# Patient Record
Sex: Male | Born: 1980 | Race: White | Hispanic: No | Marital: Single | Smoking: Former smoker
Health system: Southern US, Community
[De-identification: ages and names within clinical notes are randomized; demographics above are authoritative.]

## PROBLEM LIST (undated history)

## (undated) DIAGNOSIS — C9200 Acute myeloblastic leukemia, not having achieved remission: Secondary | ICD-10-CM

---

## 2010-10-27 ENCOUNTER — Emergency Department (HOSPITAL_COMMUNITY): Payer: Commercial Indemnity

## 2010-10-27 ENCOUNTER — Emergency Department (HOSPITAL_COMMUNITY)
Admission: EM | Admit: 2010-10-27 | Discharge: 2010-10-27 | Disposition: A | Discharge status: Home / Self Care | Payer: Commercial Indemnity | Attending: Emergency Medicine | Admitting: Emergency Medicine

## 2010-10-27 DIAGNOSIS — R0789 Other chest pain: Secondary | ICD-10-CM | POA: Insufficient documentation

## 2010-10-27 DIAGNOSIS — R0602 Shortness of breath: Secondary | ICD-10-CM | POA: Insufficient documentation

## 2010-10-27 DIAGNOSIS — J40 Bronchitis, not specified as acute or chronic: Secondary | ICD-10-CM | POA: Insufficient documentation

## 2010-10-27 LAB — POCT I-STAT, CHEM 8
BUN: 13 mg/dL (ref 6–23)
Calcium, Ion: 1.22 mmol/L (ref 1.12–1.32)
Chloride: 103 mEq/L (ref 96–112)
Creatinine, Ser: 1 mg/dL (ref 0.50–1.35)
Glucose, Bld: 135 mg/dL — ABNORMAL HIGH (ref 70–99)
TCO2: 28 mmol/L (ref 0–100)

## 2010-10-27 LAB — TROPONIN I: Troponin I: <0.3 ng/mL (ref <0.30)

## 2010-10-27 LAB — CK TOTAL AND CKMB (NOT AT ARMC): Total CK: 53 U/L (ref 7–232)

## 2013-05-11 ENCOUNTER — Ambulatory Visit (INDEPENDENT_AMBULATORY_CARE_PROVIDER_SITE_OTHER): Payer: Managed Care, Other (non HMO) | Admitting: Physician Assistant

## 2013-05-11 VITALS — BP 122/92 | HR 80 | Temp 99.0°F | Resp 18 | Ht 68.0 in | Wt 128.0 lb

## 2013-05-11 DIAGNOSIS — R110 Nausea: Secondary | ICD-10-CM

## 2013-05-11 DIAGNOSIS — R109 Unspecified abdominal pain: Secondary | ICD-10-CM

## 2013-05-11 DIAGNOSIS — R197 Diarrhea, unspecified: Secondary | ICD-10-CM

## 2013-05-11 LAB — POCT CBC
Lymph, poc: 2.8 (ref 0.6–3.4)
MCH, POC: 33.2 pg — AB (ref 27–31.2)
MCHC: 32.7 g/dL (ref 31.8–35.4)
MCV: 101.4 fL — AB (ref 80–97)
MID (cbc): 1.7 — AB (ref 0–0.9)
POC LYMPH PERCENT: 40.8 %L (ref 10–50)
POC MID %: 25.3 %M — AB (ref 0–12)
Platelet Count, POC: 167 10*3/uL (ref 142–424)
RBC: 4.13 M/uL — AB (ref 4.69–6.13)
WBC: 6.8 10*3/uL (ref 4.6–10.2)

## 2013-05-11 MED ORDER — ONDANSETRON 4 MG PO TBDP
4.0000 mg | ORAL_TABLET | Freq: Once | ORAL | Status: AC
  Administered 2013-05-11: 4 mg via ORAL

## 2013-05-11 MED ORDER — ONDANSETRON 4 MG PO TBDP: 4.0000 mg | ORAL_TABLET | Freq: Three times a day (TID) | ORAL | Status: DC | PRN

## 2013-05-11 MED ORDER — DICYCLOMINE HCL 10 MG PO CAPS: 10.0000 mg | ORAL_CAPSULE | Freq: Three times a day (TID) | ORAL | Status: DC

## 2013-05-11 NOTE — Patient Instructions (Signed)
Start with ice chips and then sips of clear fluids.  Once you are able to tolerated drinking liquids you can start with bland foods such as rice, apple sauce and toast.  If you can tolerate this you can advance diet as tolerated.  Limit dairy for several days to reduce return of diarrhea if you have been experiencing diarrhea.  

## 2013-05-11 NOTE — Progress Notes (Signed)
   Subjective:    Patient ID: Alexander Levine, male    DOB: 22-Jan-1981, 32 y.o.   MRN: [id number]  HPI Pt presents to clinic with 3 day h/o stomach cramps and loose stool that is not more frequent.  He is nauseated but he is not had an episodes of vomiting.  He has no known sick contacts with similar symptoms.  He has been eating small amounts of food and drinking a small amount of fluids.  He is having some lightheadedness when he changes positions quickly.  Pt missed work today due to the abd cramping and feeling poorly when he stood up.  He works in a [employer] and was worried about standing all day.  Review of Systems  Constitutional: Negative for fever and chills.  Gastrointestinal: Positive for nausea, abdominal pain and diarrhea. Negative for vomiting.       Objective:   Physical Exam  Vitals reviewed. Constitutional: He is oriented to person, place, and time. He appears well-developed and well-nourished.  Pt appears like he does not feel well.    HENT:  Head: Normocephalic and atraumatic.  Right Ear: Hearing and external ear normal.  Left Ear: Hearing and external ear normal.  Eyes: Conjunctivae are normal.  Neck: Normal range of motion.  Cardiovascular: Normal rate, regular rhythm and normal heart sounds.   No murmur heard. Pulmonary/Chest: Effort normal and breath sounds normal.  Abdominal: Soft. There is tenderness (generalized TTP - no area of significant tenderness).  Neurological: He is alert and oriented to person, place, and time.  Skin: Skin is warm and dry. There is pallor.  Psychiatric: He has a normal mood and affect. His behavior is normal. Judgment and thought content normal.   Results for orders placed in visit on 05/11/13  POCT CBC      Result Value Range   WBC 6.8  4.6 - 10.2 K/uL   Lymph, poc 2.8  0.6 - 3.4   POC LYMPH PERCENT 40.8  10 - 50 %L   MID (cbc) 1.7 (*) 0 - 0.9   POC MID % 25.3 (*) 0 - 12 %M   POC Granulocyte 2.3  2 - 6.9   Granulocyte percent  33.9 (*) 37 - 80 %G   RBC 4.13 (*) 4.69 - 6.13 M/uL   Hemoglobin 13.7 (*) 14.1 - 18.1 g/dL   HCT, POC 40.9 (*) 81.1 - 53.7 %   MCV 101.4 (*) 80 - 97 fL   MCH, POC 33.2 (*) 27 - 31.2 pg   MCHC 32.7  31.8 - 35.4 g/dL   RDW, POC 91.4     Platelet Count, POC 167  142 - 424 K/uL   MPV 8.3  0 - 99.8 fL       Assessment & Plan:  Diarrhea - Plan: POCT CBC, ondansetron (ZOFRAN-ODT) disintegrating tablet 4 mg (pt feels better after he had the zofran)  Nausea alone - Plan: ondansetron (ZOFRAN ODT) 4 MG disintegrating tablet  Abdominal cramping - Plan: dicyclomine (BENTYL) 10 MG capsule  Anemia - Pt has no h/o anemia and labs from 2 years ago were normal - it is mild today so pt will f/u when he is better for repeat CBC - he will take a MVI with iron and eat healthly veggies.  Pt will go home and push fluids - he will f/u if he is unable to return to work in 48h due to lightheaded feeling.  Benny Lennert PA-C 05/11/2013 3:27 PM

## 2013-05-19 ENCOUNTER — Emergency Department (HOSPITAL_COMMUNITY)
Admission: EM | Admit: 2013-05-19 | Discharge: 2013-05-20 | Disposition: A | Discharge status: Home / Self Care | Payer: Commercial Indemnity | Attending: Emergency Medicine | Admitting: Emergency Medicine

## 2013-05-19 ENCOUNTER — Emergency Department (HOSPITAL_COMMUNITY): Payer: Commercial Indemnity

## 2013-05-19 ENCOUNTER — Encounter (HOSPITAL_COMMUNITY): Payer: Self-pay | Admitting: Emergency Medicine

## 2013-05-19 DIAGNOSIS — F17200 Nicotine dependence, unspecified, uncomplicated: Secondary | ICD-10-CM | POA: Insufficient documentation

## 2013-05-19 DIAGNOSIS — R109 Unspecified abdominal pain: Secondary | ICD-10-CM

## 2013-05-19 DIAGNOSIS — K529 Noninfective gastroenteritis and colitis, unspecified: Secondary | ICD-10-CM

## 2013-05-19 DIAGNOSIS — Z79899 Other long term (current) drug therapy: Secondary | ICD-10-CM | POA: Insufficient documentation

## 2013-05-19 DIAGNOSIS — K5289 Other specified noninfective gastroenteritis and colitis: Secondary | ICD-10-CM | POA: Insufficient documentation

## 2013-05-19 LAB — URINALYSIS, ROUTINE W REFLEX MICROSCOPIC
BILIRUBIN URINE: NEGATIVE
GLUCOSE, UA: NEGATIVE mg/dL
HGB URINE DIPSTICK: NEGATIVE
Ketones, ur: 15 mg/dL — AB
Leukocytes, UA: NEGATIVE
Nitrite: NEGATIVE
PH: 6 (ref 5.0–8.0)
Protein, ur: NEGATIVE mg/dL
SPECIFIC GRAVITY, URINE: 1.026 (ref 1.005–1.030)
UROBILINOGEN UA: 0.2 mg/dL (ref 0.0–1.0)

## 2013-05-19 LAB — CBC WITH DIFFERENTIAL/PLATELET
BASOS PCT: 0 % (ref 0–1)
Basophils Absolute: 0 10*3/uL (ref 0.0–0.1)
EOS PCT: 2 % (ref 0–5)
Eosinophils Absolute: 0.3 10*3/uL (ref 0.0–0.7)
HEMATOCRIT: 36.1 % — AB (ref 39.0–52.0)
HEMOGLOBIN: 13.2 g/dL (ref 13.0–17.0)
LYMPHS ABS: 11.2 10*3/uL — AB (ref 0.7–4.0)
Lymphocytes Relative: 68 % — ABNORMAL HIGH (ref 12–46)
MCH: 34.4 pg — AB (ref 26.0–34.0)
MCHC: 36.6 g/dL — ABNORMAL HIGH (ref 30.0–36.0)
MCV: 94 fL (ref 78.0–100.0)
MONOS PCT: 0 % — AB (ref 3–12)
Monocytes Absolute: 0 10*3/uL — ABNORMAL LOW (ref 0.1–1.0)
NEUTROS ABS: 4.9 10*3/uL (ref 1.7–7.7)
Neutrophils Relative %: 27 % — ABNORMAL LOW (ref 43–77)
PROMYELOCYTES ABS: 3 %
Platelets: 213 10*3/uL (ref 150–400)
RBC: 3.84 MIL/uL — AB (ref 4.22–5.81)
RDW: 13.5 % (ref 11.5–15.5)
WBC: 16.4 10*3/uL — AB (ref 4.0–10.5)

## 2013-05-19 LAB — COMPREHENSIVE METABOLIC PANEL
ALK PHOS: 40 U/L (ref 39–117)
ALT: 13 U/L (ref 0–53)
AST: 18 U/L (ref 0–37)
Albumin: 2.8 g/dL — ABNORMAL LOW (ref 3.5–5.2)
BUN: 9 mg/dL (ref 6–23)
CO2: 26 meq/L (ref 19–32)
Calcium: 8.8 mg/dL (ref 8.4–10.5)
Chloride: 100 mEq/L (ref 96–112)
Creatinine, Ser: 0.81 mg/dL (ref 0.50–1.35)
GFR calc non Af Amer: >90 mL/min (ref >90)
GLUCOSE: 116 mg/dL — AB (ref 70–99)
POTASSIUM: 4.2 meq/L (ref 3.7–5.3)
SODIUM: 139 meq/L (ref 137–147)
TOTAL PROTEIN: 6.3 g/dL (ref 6.0–8.3)
Total Bilirubin: 0.3 mg/dL (ref 0.3–1.2)

## 2013-05-19 LAB — LIPASE, BLOOD: Lipase: 17 U/L (ref 11–59)

## 2013-05-19 MED ORDER — HYDROCODONE-ACETAMINOPHEN 5-325 MG PO TABS: 1.0000 | ORAL_TABLET | Freq: Four times a day (QID) | ORAL | Status: DC | PRN

## 2013-05-19 MED ORDER — ONDANSETRON 4 MG PO TBDP: 4.0000 mg | ORAL_TABLET | Freq: Three times a day (TID) | ORAL | Status: DC | PRN

## 2013-05-19 MED ORDER — IOHEXOL 300 MG/ML  SOLN
25.0000 mL | Freq: Once | INTRAMUSCULAR | Status: AC | PRN
  Administered 2013-05-19: 25 mL via ORAL

## 2013-05-19 MED ORDER — IOHEXOL 300 MG/ML  SOLN
80.0000 mL | Freq: Once | INTRAMUSCULAR | Status: AC | PRN
  Administered 2013-05-19: 80 mL via INTRAVENOUS

## 2013-05-19 NOTE — ED Provider Notes (Signed)
CSN: [id number]     Arrival date & time 05/19/13  1634 History   First MD Initiated Contact with Patient 05/19/13 2200     Chief Complaint  Patient presents with  . Abdominal Pain   HPI  33 y/o male with no significant past medical history who presenst with cc of abdominal pain. The pain began approximately 1.5-2 weeks ago. Was seen at urgent care and had blood work obtained. No cause of pain indentified at that visit. He was discharged home with antiemetics and pain medication. He states his pain has continued. He denies any worsening. He states the pain is generalized. He describes it as sometimes sharp and sometimes achy. He denies any radiation. He denies fevers, chills, vomiting, or diarrhea. Last had a bowel movement this morning which was normal.    History reviewed. No pertinent past medical history. History reviewed. No pertinent past surgical history. History reviewed. No pertinent family history. History  Substance Use Topics  . Smoking status: Current Every Day Smoker    Types: Cigarettes  . Smokeless tobacco: Not on file  . Alcohol Use: No    Review of Systems  Constitutional: Negative for fever and chills.  Respiratory: Negative for cough.   Cardiovascular: Negative for chest pain.  Gastrointestinal: Positive for abdominal pain. Negative for nausea, vomiting, diarrhea, constipation and blood in stool.  Genitourinary: Negative for dysuria, frequency, hematuria and testicular pain.  All other systems reviewed and are negative.    Allergies  Review of patient's allergies indicates no known allergies.  Home Medications   Current Outpatient Rx  Name  Route  Sig  Dispense  Refill  . bisacodyl (BISACODYL) 5 MG EC tablet   Oral   Take 5 mg by mouth daily as needed for moderate constipation.         . dicyclomine (BENTYL) 10 MG capsule   Oral   Take 1 capsule (10 mg total) by mouth 4 (four) times daily -  before meals and at bedtime.   10 capsule   0   .  Multiple Vitamin (MULTIVITAMIN WITH MINERALS) TABS tablet   Oral   Take 1 tablet by mouth daily.         Marland Kitchen HYDROcodone-acetaminophen (NORCO) 5-325 MG per tablet   Oral   Take 1 tablet by mouth every 6 (six) hours as needed.   10 tablet   0   . ondansetron (ZOFRAN ODT) 4 MG disintegrating tablet   Oral   Take 1 tablet (4 mg total) by mouth every 8 (eight) hours as needed for nausea or vomiting. 4mg  ODT q4 hours prn nausea/vomit   10 tablet   0    BP 114/70  Pulse 84  Temp(Src) 99.6 F (37.6 C) (Oral)  Resp 18  Ht 5\' 8"  (1.727 m)  Wt 130 lb (58.968 kg)  BMI 19.77 kg/m2  SpO2 98% Physical Exam  Constitutional: He is oriented to person, place, and time. He appears well-developed and well-nourished. No distress.  HENT:  Head: Normocephalic and atraumatic.  Mouth/Throat: No oropharyngeal exudate.  Eyes: Conjunctivae are normal. Pupils are equal, round, and reactive to light.  Neck: Normal range of motion. Neck supple.  Cardiovascular: Normal rate and normal heart sounds.  Exam reveals no gallop and no friction rub.   No murmur heard. Pulmonary/Chest: Effort normal and breath sounds normal.  Abdominal: Soft. He exhibits no distension. There is generalized tenderness (mild).  Musculoskeletal: Normal range of motion. He exhibits no edema and no tenderness.  Neurological: He is alert and oriented to person, place, and time. He has normal strength and normal reflexes. No cranial nerve deficit or sensory deficit. Coordination normal. GCS eye subscore is 4. GCS verbal subscore is 5. GCS motor subscore is 6.  Skin: Skin is warm and dry.    ED Course  Procedures (including critical care time) Labs Review Labs Reviewed  COMPREHENSIVE METABOLIC PANEL - Abnormal; Notable for the following:    Glucose, Bld 116 (*)    Albumin 2.8 (*)    All other components within normal limits  URINALYSIS, ROUTINE W REFLEX MICROSCOPIC - Abnormal; Notable for the following:    Ketones, ur 15 (*)     All other components within normal limits  CBC WITH DIFFERENTIAL - Abnormal; Notable for the following:    WBC 16.4 (*)    RBC 3.84 (*)    HCT 36.1 (*)    MCH 34.4 (*)    MCHC 36.6 (*)    Neutrophils Relative % 27 (*)    Lymphocytes Relative 68 (*)    Monocytes Relative 0 (*)    Lymphs Abs 11.2 (*)    Monocytes Absolute 0.0 (*)    All other components within normal limits  LIPASE, BLOOD  PATHOLOGIST SMEAR REVIEW   Imaging Review Ct Abdomen Pelvis W Contrast  05/19/2013   CLINICAL DATA:  Abdominal pain  EXAM: CT ABDOMEN AND PELVIS WITH CONTRAST  TECHNIQUE: Multidetector CT imaging of the abdomen and pelvis was performed using the standard protocol following bolus administration of intravenous contrast.  CONTRAST:  69mL OMNIPAQUE IOHEXOL 300 MG/ML  SOLN  COMPARISON:  None.  FINDINGS: BODY WALL: Unremarkable.  LOWER CHEST: Unremarkable.  ABDOMEN/PELVIS:  Liver: No focal abnormality.  Biliary: No evidence of biliary obstruction or stone.  Pancreas: Unremarkable.  Spleen: Unremarkable.  Adrenals: Unremarkable.  Kidneys and ureters: No hydronephrosis. 12 mm and 9 mm presumed cysts in the right kidney. Contrast excretion limits sensitivity for detecting stones.  Bladder: Unremarkable.  Reproductive: Unremarkable.  Bowel: There is bowel wall thickening from submucosal edema affecting the distal duodenum and proximal jejunum. In between the inflamed segments, there is a nonthickened segment, suggesting skip lesion. Local lymphadenopathy and mesenteric fat infiltration. No evidence of abscess, obstruction, or perforation. Contrast in the the appendix, versus appendicolith. No acute appendicitis.  Retroperitoneum: No mass or adenopathy.  Peritoneum: Small volume free fluid in the pelvis, likely reactive to above.  OSSEOUS: No acute abnormalities. No sacroiliitis.  IMPRESSION: Proximal enteritis which could be infectious or inflammatory. There appears to be a skip segment, increasing the chances of inflammatory  bowel disease. No obstruction or perforation.   Electronically Signed   By: Jorje Guild M.D.   On: 05/19/2013 23:41    EKG Interpretation   None       MDM   Here with abdominal pain for last 1.5 weeks. Afebrile. VSS. Well appearing. Labs unremarkable. CT with evidence of enteritis with possible skip segment. Has never had history of weight loss, mucous diarrhea, bloody stools or fevers. Unlikely to be IBD but will refer to GI if he continues to have symptoms. Continue with antiemetics prn. Script for norco provided. Instructed to eat a bland diet. Follow up with a pcp. Return precautions given and discussed with the patient who was in agreement with the plan.    1. Abdominal pain   2. Enteritis       Donita Brooks, MD 05/20/13 [phone]

## 2013-05-19 NOTE — ED Notes (Signed)
Pt reports generalized abd pain x 2 week and having constipation, denies any n/v. No acute distress noted at triage.

## 2013-05-19 NOTE — ED Notes (Signed)
Patient transported to CT 

## 2013-05-19 NOTE — Discharge Instructions (Signed)

## 2013-05-20 LAB — PATHOLOGIST SMEAR REVIEW

## 2013-05-20 NOTE — ED Provider Notes (Signed)
I have personally seen and examined the patient.  I have discussed the plan of care with the resident.  I have reviewed the documentation on PMH/FH/Soc. History.  I have reviewed the documentation of the resident and agree.  Pt with continued abd pain and had diffuse tenderness on my evaluation, CT imaging advised.   Pt will need f/u with GI as outpatient   Sharyon Cable, MD 05/20/13 [phone]

## 2013-07-12 ENCOUNTER — Ambulatory Visit: Payer: Self-pay | Admitting: Hematology and Oncology

## 2013-08-01 ENCOUNTER — Inpatient Hospital Stay: Payer: Self-pay | Admitting: Internal Medicine

## 2013-08-01 LAB — URINALYSIS, COMPLETE
BACTERIA: NONE SEEN
Bilirubin,UR: NEGATIVE
Blood: NEGATIVE
Glucose,UR: NEGATIVE mg/dL (ref 0–75)
KETONE: NEGATIVE
Leukocyte Esterase: NEGATIVE
NITRITE: NEGATIVE
Ph: 6 (ref 4.5–8.0)
Protein: 30
RBC,UR: 1 /HPF (ref 0–5)
SQUAMOUS EPITHELIAL: NONE SEEN
Specific Gravity: 1.02 (ref 1.003–1.030)

## 2013-08-01 LAB — COMPREHENSIVE METABOLIC PANEL
ALBUMIN: 2.8 g/dL — AB (ref 3.4–5.0)
ALK PHOS: 68 U/L
AST: 51 U/L — AB (ref 15–37)
Anion Gap: 6 — ABNORMAL LOW (ref 7–16)
BILIRUBIN TOTAL: 0.4 mg/dL (ref 0.2–1.0)
BUN: 16 mg/dL (ref 7–18)
CALCIUM: 8.7 mg/dL (ref 8.5–10.1)
CHLORIDE: 101 mmol/L (ref 98–107)
Co2: 29 mmol/L (ref 21–32)
Creatinine: 1.19 mg/dL (ref 0.60–1.30)
EGFR (African American): >60
EGFR (Non-African Amer.): >60
GLUCOSE: 131 mg/dL — AB (ref 65–99)
Osmolality: 275 (ref 275–301)
POTASSIUM: 4.2 mmol/L (ref 3.5–5.1)
SGPT (ALT): 65 U/L (ref 12–78)
SODIUM: 136 mmol/L (ref 136–145)
Total Protein: 7.9 g/dL (ref 6.4–8.2)

## 2013-08-01 LAB — DIFFERENTIAL
Bands: 2 %
Lymphocytes: 21 %
MONOS PCT: 17 %
Metamyelocyte: 3 %
Myelocyte: 1 %
OTHER CELLS BLOOD: 30
SEGMENTED NEUTROPHILS: 26 %

## 2013-08-01 LAB — CBC
HCT: 13.6 % — CL (ref 40.0–52.0)
HGB: 4.8 g/dL — CL (ref 13.0–18.0)
MCH: 38.6 pg — AB (ref 26.0–34.0)
MCHC: 35.7 g/dL (ref 32.0–36.0)
MCV: 108 fL — AB (ref 80–100)
Platelet: 59 10*3/uL — ABNORMAL LOW (ref 150–440)
RBC: 1.25 10*6/uL — ABNORMAL LOW (ref 4.40–5.90)
RDW: 21.2 % — ABNORMAL HIGH (ref 11.5–14.5)
WBC: 13.8 10*3/uL — AB (ref 3.8–10.6)

## 2013-08-01 LAB — IRON AND TIBC
IRON BIND. CAP.(TOTAL): 230 ug/dL — AB (ref 250–450)
IRON SATURATION: 36 %
Iron: 82 ug/dL (ref 65–175)
Unbound Iron-Bind.Cap.: 148 ug/dL

## 2013-08-01 LAB — FOLATE: FOLIC ACID: 19.1 ng/mL (ref 3.1–100.0)

## 2013-08-01 LAB — LIPASE, BLOOD: Lipase: 55 U/L — ABNORMAL LOW (ref 73–393)

## 2013-08-01 LAB — FERRITIN: Ferritin (ARMC): 2568 ng/mL — ABNORMAL HIGH (ref 8–388)

## 2013-08-02 LAB — BASIC METABOLIC PANEL
ANION GAP: 4 — AB (ref 7–16)
BUN: 14 mg/dL (ref 7–18)
CALCIUM: 7.9 mg/dL — AB (ref 8.5–10.1)
CO2: 28 mmol/L (ref 21–32)
Chloride: 103 mmol/L (ref 98–107)
Creatinine: 1 mg/dL (ref 0.60–1.30)
EGFR (African American): >60
Glucose: 102 mg/dL — ABNORMAL HIGH (ref 65–99)
Osmolality: 271 (ref 275–301)
Potassium: 3.9 mmol/L (ref 3.5–5.1)
Sodium: 135 mmol/L — ABNORMAL LOW (ref 136–145)

## 2013-08-02 LAB — CBC WITH DIFFERENTIAL/PLATELET
BANDS NEUTROPHIL: 2 %
HCT: 17.2 % — ABNORMAL LOW (ref 40.0–52.0)
HGB: 6 g/dL — ABNORMAL LOW (ref 13.0–18.0)
LYMPHS PCT: 22 %
MCH: 33.9 pg (ref 26.0–34.0)
MCHC: 34.7 g/dL (ref 32.0–36.0)
MCV: 98 fL (ref 80–100)
MONOS PCT: 8 %
Myelocyte: 2 %
OTHER CELLS BLOOD: 40
Platelet: 49 10*3/uL — ABNORMAL LOW (ref 150–440)
RBC: 1.76 10*6/uL — AB (ref 4.40–5.90)
RDW: 25.3 % — AB (ref 11.5–14.5)
Segmented Neutrophils: 26 %
WBC: 13.6 10*3/uL — ABNORMAL HIGH (ref 3.8–10.6)

## 2013-08-02 LAB — LACTATE DEHYDROGENASE: LDH: 798 U/L — ABNORMAL HIGH (ref 85–241)

## 2013-08-02 LAB — HEMOGLOBIN: HGB: 6.6 g/dL — ABNORMAL LOW (ref 13.0–18.0)

## 2013-08-03 LAB — COMPREHENSIVE METABOLIC PANEL
ALT: 53 U/L (ref 12–78)
AST: 40 U/L — AB (ref 15–37)
Albumin: 2.3 g/dL — ABNORMAL LOW (ref 3.4–5.0)
Alkaline Phosphatase: 93 U/L
Anion Gap: 4 — ABNORMAL LOW (ref 7–16)
BUN: 13 mg/dL (ref 7–18)
Bilirubin,Total: 0.5 mg/dL (ref 0.2–1.0)
CHLORIDE: 106 mmol/L (ref 98–107)
CO2: 27 mmol/L (ref 21–32)
CREATININE: 0.83 mg/dL (ref 0.60–1.30)
Calcium, Total: 8.1 mg/dL — ABNORMAL LOW (ref 8.5–10.1)
EGFR (African American): >60
GLUCOSE: 89 mg/dL (ref 65–99)
OSMOLALITY: 273 (ref 275–301)
Potassium: 4.2 mmol/L (ref 3.5–5.1)
Sodium: 137 mmol/L (ref 136–145)
Total Protein: 6.6 g/dL (ref 6.4–8.2)

## 2013-08-03 LAB — CBC WITH DIFFERENTIAL/PLATELET
HCT: 22.8 % — ABNORMAL LOW (ref 40.0–52.0)
HGB: 7.7 g/dL — AB (ref 13.0–18.0)
LYMPHS PCT: 32 %
MCH: 32.2 pg (ref 26.0–34.0)
MCHC: 33.9 g/dL (ref 32.0–36.0)
MCV: 95 fL (ref 80–100)
Metamyelocyte: 1 %
Monocytes: 7 %
Myelocyte: 1 %
Other Cells Blood: 39
PLATELETS: 48 10*3/uL — AB (ref 150–440)
RBC: 2.4 10*6/uL — ABNORMAL LOW (ref 4.40–5.90)
RDW: 22.7 % — ABNORMAL HIGH (ref 11.5–14.5)
Segmented Neutrophils: 20 %
WBC: 14.9 10*3/uL — ABNORMAL HIGH (ref 3.8–10.6)

## 2013-08-06 LAB — CULTURE, BLOOD (SINGLE)

## 2013-12-12 ENCOUNTER — Ambulatory Visit: Payer: Self-pay | Admitting: Oncology

## 2013-12-13 LAB — COMPREHENSIVE METABOLIC PANEL
ALBUMIN: 3.1 g/dL — AB (ref 3.4–5.0)
ALK PHOS: 76 U/L
ANION GAP: 6 — AB (ref 7–16)
AST: 21 U/L (ref 15–37)
BUN: 15 mg/dL (ref 7–18)
Bilirubin,Total: 1.3 mg/dL — ABNORMAL HIGH (ref 0.2–1.0)
CALCIUM: 8.9 mg/dL (ref 8.5–10.1)
Chloride: 102 mmol/L (ref 98–107)
Co2: 26 mmol/L (ref 21–32)
Creatinine: 1.01 mg/dL (ref 0.60–1.30)
EGFR (Non-African Amer.): >60
GLUCOSE: 112 mg/dL — AB (ref 65–99)
OSMOLALITY: 270 (ref 275–301)
POTASSIUM: 3.5 mmol/L (ref 3.5–5.1)
SGPT (ALT): 30 U/L
Sodium: 134 mmol/L — ABNORMAL LOW (ref 136–145)
Total Protein: 7.2 g/dL (ref 6.4–8.2)

## 2013-12-13 LAB — PHOSPHORUS: PHOSPHORUS: 1.9 mg/dL — AB (ref 2.5–4.9)

## 2013-12-13 LAB — PROTIME-INR
INR: 1.2
Prothrombin Time: 14.9 secs — ABNORMAL HIGH (ref 11.5–14.7)

## 2013-12-13 LAB — MAGNESIUM: Magnesium: 1.6 mg/dL — ABNORMAL LOW

## 2013-12-13 LAB — TROPONIN I

## 2013-12-14 ENCOUNTER — Inpatient Hospital Stay: Payer: Self-pay | Admitting: Internal Medicine

## 2013-12-14 LAB — URINALYSIS, COMPLETE
BILIRUBIN, UR: NEGATIVE
GLUCOSE, UR: NEGATIVE mg/dL (ref 0–75)
KETONE: NEGATIVE
Leukocyte Esterase: NEGATIVE
Nitrite: NEGATIVE
Ph: 5 (ref 4.5–8.0)
SPECIFIC GRAVITY: 1.015 (ref 1.003–1.030)
SQUAMOUS EPITHELIAL: NONE SEEN

## 2013-12-14 LAB — BASIC METABOLIC PANEL
Anion Gap: 6 — ABNORMAL LOW (ref 7–16)
BUN: 8 mg/dL (ref 7–18)
Calcium, Total: 8.2 mg/dL — ABNORMAL LOW (ref 8.5–10.1)
Chloride: 105 mmol/L (ref 98–107)
Co2: 26 mmol/L (ref 21–32)
Creatinine: 0.98 mg/dL (ref 0.60–1.30)
EGFR (African American): >60
EGFR (Non-African Amer.): >60
Glucose: 101 mg/dL — ABNORMAL HIGH (ref 65–99)
Osmolality: 272 (ref 275–301)
Potassium: 3.3 mmol/L — ABNORMAL LOW (ref 3.5–5.1)
Sodium: 137 mmol/L (ref 136–145)

## 2013-12-14 LAB — CBC WITH DIFFERENTIAL/PLATELET
BASOS ABS: 0 10*3/uL (ref 0.0–0.1)
BASOS PCT: 0 %
Basophil #: 0 10*3/uL (ref 0.0–0.1)
Basophil %: 0 %
EOS ABS: 0 10*3/uL (ref 0.0–0.7)
EOS PCT: 4.2 %
Eosinophil #: 0 10*3/uL (ref 0.0–0.7)
Eosinophil %: 7 %
HCT: 21.9 % — ABNORMAL LOW (ref 40.0–52.0)
HCT: 18.2 % — ABNORMAL LOW (ref 40.0–52.0)
HGB: 8 g/dL — AB (ref 13.0–18.0)
HGB: 6.6 g/dL — ABNORMAL LOW (ref 13.0–18.0)
LYMPHS ABS: 0.3 10*3/uL — AB (ref 1.0–3.6)
Lymphocyte #: 0.3 10*3/uL — ABNORMAL LOW (ref 1.0–3.6)
Lymphocyte %: 90.8 %
Lymphocyte %: 80.7 %
MCH: 32.1 pg (ref 26.0–34.0)
MCH: 32.2 pg (ref 26.0–34.0)
MCHC: 36.5 g/dL — ABNORMAL HIGH (ref 32.0–36.0)
MCHC: 36.3 g/dL — ABNORMAL HIGH (ref 32.0–36.0)
MCV: 88 fL (ref 80–100)
MCV: 89 fL (ref 80–100)
MONOS PCT: 3.6 %
Monocyte #: 0 x10 3/mm — ABNORMAL LOW (ref 0.2–1.0)
Monocyte #: 0 x10 3/mm — ABNORMAL LOW (ref 0.2–1.0)
Monocyte %: 10.2 %
NEUTROS PCT: 1.4 %
Neutrophil #: 0 10*3/uL — ABNORMAL LOW (ref 1.4–6.5)
Neutrophil #: 0 10*3/uL — ABNORMAL LOW (ref 1.4–6.5)
Neutrophil %: 2.1 %
Platelet: 3 10*3/uL — CL (ref 150–440)
Platelet: 16 10*3/uL — CL (ref 150–440)
RBC: 2.49 10*6/uL — ABNORMAL LOW (ref 4.40–5.90)
RBC: 2.06 10*6/uL — ABNORMAL LOW (ref 4.40–5.90)
RDW: 16.1 % — AB (ref 11.5–14.5)
RDW: 16 % — ABNORMAL HIGH (ref 11.5–14.5)
WBC: 0.4 10*3/uL — AB (ref 3.8–10.6)
WBC: 0.3 10*3/uL — CL (ref 3.8–10.6)

## 2013-12-14 LAB — PLATELET COUNT: Platelet: 15 10*3/uL — CL (ref 150–440)

## 2013-12-14 LAB — MAGNESIUM: Magnesium: 2.2 mg/dL

## 2013-12-14 LAB — PHOSPHORUS: Phosphorus: 1.9 mg/dL — ABNORMAL LOW (ref 2.5–4.9)

## 2013-12-15 LAB — PHOSPHORUS
Phosphorus: 2.3 mg/dL — ABNORMAL LOW (ref 2.5–4.9)
Phosphorus: 2.8 mg/dL (ref 2.5–4.9)

## 2013-12-15 LAB — CBC WITH DIFFERENTIAL/PLATELET
Basophil #: 0 10*3/uL (ref 0.0–0.1)
Basophil #: 0 10*3/uL (ref 0.0–0.1)
Basophil %: 0.3 %
Basophil %: 0.3 %
Eosinophil #: 0 10*3/uL (ref 0.0–0.7)
Eosinophil #: 0 10*3/uL (ref 0.0–0.7)
Eosinophil %: 8.1 %
Eosinophil %: 5.5 %
HCT: 23.3 % — ABNORMAL LOW (ref 40.0–52.0)
HCT: 24.7 % — ABNORMAL LOW (ref 40.0–52.0)
HGB: 8.2 g/dL — ABNORMAL LOW (ref 13.0–18.0)
HGB: 9.2 g/dL — ABNORMAL LOW (ref 13.0–18.0)
Lymphocyte #: 0.3 10*3/uL — ABNORMAL LOW (ref 1.0–3.6)
Lymphocyte #: 0.4 10*3/uL — ABNORMAL LOW (ref 1.0–3.6)
Lymphocyte %: 66.6 %
Lymphocyte %: 54.5 %
MCH: 31.5 pg (ref 26.0–34.0)
MCH: 32.1 pg (ref 26.0–34.0)
MCHC: 35.4 g/dL (ref 32.0–36.0)
MCHC: 35.6 g/dL (ref 32.0–36.0)
MCV: 89 fL (ref 80–100)
MCV: 86 fL (ref 80–100)
Monocyte #: 0.1 x10 3/mm — ABNORMAL LOW (ref 0.2–1.0)
Monocyte #: 0.1 x10 3/mm — ABNORMAL LOW (ref 0.2–1.0)
Monocyte %: 15.9 %
Monocyte %: 18.2 %
Neutrophil #: 0 10*3/uL — ABNORMAL LOW (ref 1.4–6.5)
Neutrophil #: 0.1 10*3/uL — ABNORMAL LOW (ref 1.4–6.5)
Neutrophil %: 9.1 %
Neutrophil %: 21.5 %
Platelet: 11 10*3/uL — CL (ref 150–440)
Platelet: 10 10*3/uL — CL (ref 150–440)
RBC: 2.62 10*6/uL — ABNORMAL LOW (ref 4.40–5.90)
RBC: 2.87 10*6/uL — ABNORMAL LOW (ref 4.40–5.90)
RDW: 16.3 % — ABNORMAL HIGH (ref 11.5–14.5)
RDW: 15.9 % — ABNORMAL HIGH (ref 11.5–14.5)
WBC: 0.4 10*3/uL — CL (ref 3.8–10.6)
WBC: 0.7 10*3/uL — CL (ref 3.8–10.6)

## 2013-12-15 LAB — VANCOMYCIN, TROUGH: Vancomycin, Trough: 5 ug/mL — ABNORMAL LOW (ref 10–20)

## 2013-12-15 LAB — URINE CULTURE

## 2013-12-15 LAB — POTASSIUM: Potassium: 3.9 mmol/L (ref 3.5–5.1)

## 2013-12-16 LAB — CBC WITH DIFFERENTIAL/PLATELET
Basophil #: 0 10*3/uL (ref 0.0–0.1)
Basophil %: 0.2 %
Eosinophil #: 0.1 10*3/uL (ref 0.0–0.7)
Eosinophil %: 6.6 %
HCT: 23.4 % — ABNORMAL LOW (ref 40.0–52.0)
HGB: 8.3 g/dL — ABNORMAL LOW (ref 13.0–18.0)
Lymphocyte #: 0.5 10*3/uL — ABNORMAL LOW (ref 1.0–3.6)
Lymphocyte %: 45.1 %
MCH: 31.5 pg (ref 26.0–34.0)
MCHC: 35.6 g/dL (ref 32.0–36.0)
MCV: 89 fL (ref 80–100)
Monocyte #: 0.2 x10 3/mm (ref 0.2–1.0)
Monocyte %: 16.1 %
Neutrophil #: 0.3 10*3/uL — ABNORMAL LOW (ref 1.4–6.5)
Neutrophil %: 32 %
Platelet: 20 10*3/uL — CL (ref 150–440)
RBC: 2.64 10*6/uL — ABNORMAL LOW (ref 4.40–5.90)
RDW: 16 % — ABNORMAL HIGH (ref 11.5–14.5)
WBC: 1 10*3/uL — CL (ref 3.8–10.6)

## 2013-12-18 LAB — CULTURE, BLOOD (SINGLE)

## 2014-01-12 ENCOUNTER — Ambulatory Visit: Payer: Self-pay | Admitting: Oncology

## 2014-09-04 NOTE — Consult Note (Signed)
PATIENT NAME:  Alexander Levine, Alexander Levine MR#:  [id number] DATE OF BIRTH:  [birth year]  DATE OF CONSULTATION:  08/02/2013  REFERRING PHYSICIAN:   CONSULTING PHYSICIAN:  Lucilla Lame, MD  REASON FOR CONSULTATION:  Anemia with abdominal pain.   HISTORY OF PRESENT ILLNESS: This patient is a 34 year old gentleman who reports that he had ulcers 2 years ago and had an episode of abdominal pain that started in January. He had been seen at [facility] and was to follow up with GI. The patient at that time said that there was a finding on a CT scan showing possible inflammatory bowel disease versus infection. The patient says that he had felt better and did not follow up with GI until he started having symptoms again recently and was set up to see a gastroenterologist in April. He now comes with abdominal pain and fevers and had a temperature of 101 in the Emergency Department. The patient was found at that time to also have a hemoglobin of 4.8 with hematocrit of 13.6 and platelet count of 59,000. He had been taking anti-inflammatory medications for his pain but denies seeing any bright red blood or black stools. He has been found to be heme-positive. He reports that his abdominal pain is usually after he eats but can be at any time. He also denies any unexplained weight loss and states that he was approximately 10 pounds heavier a year ago but that this is a stable weight that he is right now. There is no report of any nausea or vomiting. The patient did have a CT scan that showed possible gastritis versus inflammatory bowel disease with free fluid in the abdomen.   PAST MEDICAL HISTORY: None.   MEDICATION AT HOME: NSAIDs.   ALLERGIES: No known drug allergies.   PAST SURGICAL HISTORY: None.   SOCIAL HISTORY: The patient quit smoking several months ago. No alcohol or IV drug use.   FAMILY HISTORY: Noncontributory.   REVIEW OF SYSTEMS: A10-point review of systems is negative except what was stated above.    PHYSICAL EXAMINATION: GENERAL: The patient is sitting in bed, no apparent distress, answering questions appropriately.  VITAL SIGNS: Temperature 98.6, pulse 76, respirations 17, blood pressure 108/66, pulse oximetry 98%.  GENERAL: Patient is alert and oriented x 3, in no apparent distress.   HEENT: Normocephalic, atraumatic. Extraocular motor intact. Pupils equally round and reactive to light and accommodation.  NECK:  Without JVD, without lymphadenopathy.  LUNGS: Clear to auscultation bilaterally.  HEART: Regular rate and rhythm without murmurs, rubs or gallops.  ABDOMEN: Soft with diffuse abdominal tenderness which is worse in the left upper quadrant without rebound but mild guarding.  EXTREMITIES: Without cyanosis, clubbing or edema.  NEUROLOGICAL: Grossly intact.  SKIN: Without any rashes or lesions.   LABORATORY, DIAGNOSTIC AND RADIOLOGICAL DATA:  Labs: Ferritin 2568, lipase 55, iron 36, total iron-binding capacity low at 230. Hemoglobin this morning 6.6 after transfusion, MCV 108 on admission, down to 98 this morning with a high RDW of 21.2 on admission. CT scan shows interval improvement in the enteritis seen on the comparison exam, increase in the intraperitoneal free fluid within the pelvis, mild thickening of the distal stomach suggestive of gastritis can be associated with inflammatory bowel disease, new bandlike atelectasis on the right middle lobe.   ASSESSMENT AND PLAN: This patient is a 34 year old gentleman with labs showing anemia with a high MCV and normal iron stores. The patient has been seen by oncology, who suggested that this may be a  chronic leukemia versus lymphoma. The patient will likely need a gastroenterology workup after the hematological workup is complete. The patient's hemoglobin is stable at this time. We will follow along with you until the patient is deemed ready for his esophagogastroduodenoscopy and colonoscopy.   Thank you very much for involving me in the  care of this patient. If you have any questions, please do not hesitate to call.   ____________________________ Lucilla Lame, MD dw:cs D: 08/02/2013 14:35:40 ET T: 08/02/2013 18:54:47 ET JOB#: [id number]  cc: Lucilla Lame, MD, <Dictator> Lucilla Lame MD ELECTRONICALLY SIGNED 08/03/2013 13:36

## 2014-09-04 NOTE — Consult Note (Signed)
Chief Complaint:  Subjective/Chief Complaint Pt has mild 3/10 upper abdominal discomfort.  Denies rectal bleeding or melena.  He is anxious & concerned about new cancer diagnosis & transfer to [facility].   VITAL SIGNS/ANCILLARY NOTES: **Vital Signs.:   23-Mar-15 11:38  Vital Signs Type Routine  Temperature Temperature (F) 99.2  Celsius 37.3  Temperature Source oral  Pulse Pulse 67  Systolic BP Systolic BP 202  Diastolic BP (mmHg) Diastolic BP (mmHg) 65  Mean BP 79  Pulse Ox % Pulse Ox % 97  Pulse Ox Activity Level  At rest  Oxygen Delivery Room Air/ 21 %   Brief Assessment:  GEN no acute distress, thin, A/Ox3, multiple family members at bedside   Cardiac Regular   Respiratory normal resp effort   Gastrointestinal details normal Soft  Nondistended  Bowel sounds normal   EXTR negative cyanosis/clubbing, negative edema   Additional Physical Exam Skin: pale, warm, dry   Assessment/Plan:  Assessment/Plan:  Assessment Abdominal pain & rectal bleeding in setting of pancytopenia with Acute leukemia/anemia: Pt being transferred to [facility] today & should continue GI work-up there Anemia: s/p 3 units of packed red blood cell transfusion with hemoglobin level of 7.7 today in with questionable diagnosis of bowel infection in January 2015.   Plan We will sign off on patient.  Advised to follow up as needed.  Please call if there are any questions or concerns.  Thank you for allowing [place] to participate in the care of this patient.   Electronic Signatures: Andria Meuse (NP)  (Signed 23-Mar-15 11:43)  Authored: Chief Complaint, VITAL SIGNS/ANCILLARY NOTES, Brief Assessment, Assessment/Plan   Last Updated: 23-Mar-15 11:43 by Andria Meuse (NP)

## 2014-09-04 NOTE — Consult Note (Signed)
Brief Consult Note: Diagnosis: patient admitted with abd pain and anemia. The patient has had the pain since Jan and had ulcers 2 years ago. He is not on any medication for this. His iron was found to be normal and he had some free fluid in his abd and a thickened stomach.   Patient was seen by consultant.   Comments: The patient presents with anemia of chronic disease and heme positive stools with normal iron. The patients past imaging has shown findings consistant with possible IBD. The patient is to be seen by hematology and may need an EGD and colonoscopy for the heme positive stools and abd pain. There is no reports of any diarrhea or gross blood loss.  Electronic Signatures: Lucilla Lame (MD)  (Signed 22-Mar-15 11:06)  Authored: Brief Consult Note   Last Updated: 22-Mar-15 11:06 by Lucilla Lame (MD)

## 2014-09-04 NOTE — Consult Note (Signed)
Patient's last fever was ~7pm last night. (12/14/13). Pancytopenia is essentially unchanged.  If he remains afebrile overnight and all cutures continue to be negative, OK to d/c from an oncology standpoint in the AM with close f/u at [facility] within 24-48 hrs after discharge. follow if patient remains in hospital.  Electronic Signatures: Delight Hoh (MD)  (Signed on 04-Aug-15 19:58)  Authored  Last Updated: 04-Aug-15 19:58 by Delight Hoh (MD)

## 2014-09-04 NOTE — H&P (Signed)
PATIENT NAME:  Alexander Levine, Alexander Levine MR#:  [id number] DATE OF BIRTH:  09/05/1980  DATE OF ADMISSION:  12/14/2013  REFERRING PHYSICIAN: Dr. Lisa Roca  PRIMARY CARE PHYSICIAN: [facility]  ADMITTING DIAGNOSES: Neutropenic fever, acute myelocytic leukemia, anemia, and thrombocytopenia.   HISTORY OF PRESENT ILLNESS: This is a 34 year old Caucasian male who presents to the Emergency Department with fever of 100.7 degrees Fahrenheit. The patient completed his last maintenance dose of chemotherapy for acute myelocytic leukemia one week ago. He felt nauseated, and had one episode of nonbloody, nonbilious vomiting earlier today, which prompted him to check his temperature, per routine. He came to the hospital as instructed. He  denies acute pain or chills at this time.   REVIEW OF SYSTEMS:  CONSTITUTIONAL: The patient admits to fever and weakness.  EYES: The patient denies inflammation or decreased visual acuity.  ENT: The patient denies stomatitis or epistaxis.  RESPIRATORY: The patient denies cough or shortness of breath.  CONSTITUTIONAL: The patient denies chest pain, orthopnea or palpitations.  GASTROINTESTINAL: The patient denies abdominal pain. He admits to nausea and vomiting. GENITOURINARY: The patient denies dysuria or increased frequency or hesitancy. ENDOCRINE: The patient denies nocturia, polyuria or intolerance to heat or cold.  HEMATOLOGIC AND LYMPHATIC: The patient admits to some easy bruising, but he denies active bleeding at this time.  INTEGUMENT: The patient denies rash or lesions.  MUSCULOSKELETAL: The patient admits to some myalgias but denies arthralgias.  NEUROLOGIC: The patient denies numbness or weakness or headache.  PSYCHIATRIC: The patient denies suicidal ideation or homicidal ideation.   PAST MEDICAL HISTORY: Significant for acute myelocytic leukemia.   PAST SURGICAL HISTORY: None.   FAMILY HISTORY: Negative for cancers. He denies hypertension,  diabetes or heart disease in the family history.   SOCIAL HISTORY: The patient does not smoke, drink or do any drugs. He has a fiancee.  MEDICATIONS: Medications Ambien 5 mg 1 tab p.o. at bedtime as needed, pantoprazole 40 mg every 12 hours, ceftazidime 2 grams every 8 hours and alprazolam 0.25 mg 1 tab p.o. every 8 hours.   ALLERGIES: COMPAZINE.   PHYSICAL EXAM:  VITAL SIGNS: Currently, heart rate is 97 temperature 99.2, respiratory rate 16, blood pressure 104/58, sats 100% on room air.  GENERAL: The patient is oriented x3, in no apparent distress.  HEENT: Normocephalic, atraumatic. Pupils are equal, round and reactive to light and accommodation. Extraocular movements are intact. Moist mucous membranes. No oral lesions. NECK: Trachea is midline. No adenopathy.  CHEST: Symmetric and atraumatic.  CARDIOVASCULAR: Regular rate and rhythm. Normal S1, S2. No rubs, clicks or murmurs. LUNGS: Clear to auscultation bilaterally, normal effort.  ABDOMEN: Positive bowel sounds. Soft, nontender, nontender, nondistended. No hepatosplenomegaly.  GENITOURINARY: Deferred.  MUSCULOSKELETAL: The patient moves all four extremities equally. Has 5/5 strength in upper and lower extremities bilaterally.  SKIN: No rashes or lesions.  EXTREMITIES: No clubbing, cyanosis, or edema.  NEUROLOGIC: Cranial nerves II through XII grossly intact.  PSYCHIATRIC: Mood is normal. Affect is congruent.   PERTINENT LABORATORY RESULTS AND RADIOLOGIC FINDINGS: Sodium 134. INR 1.2. Magnesium 1.6. Total bilirubin 1.3, phosphorus 1.9.   ASSESSMENT AND PLAN: This is a 34 year old male with acute myelocytic leukemia and neutropenic fever.  1.  Neutropenic fever. We will cover the blood cultures obtained as well as urine culture. We will cover the patient with vancomycin and Zosyn. It appears he has already received one dose of cefepime in the Emergency Department. Will monitor his neutrophil count.  2.  Acute  myelocytic leukemia. The  patient's white blood cell count is currently 400. His platelets are also low, as expected following chemotherapy. [facility] has been contacted, and transfer is pending when a bed opens.  3.  Anemia secondary to the above. The patient does not require a blood transfusion at this time. 4.  Thrombocytopenia, also secondary to acute myelocytic leukemia. Oncologist at [facility] recommended transfusing. Noted, the patient does not currently have any bleeding diatheses. We will premedicate per oncology instructions.  5.  Electrolyte abnormalities. Replete magnesium and phosphorous. I will place the patient on normal saline after his platelet transfusion, which should correct his sodium.  6.  Gastrointestinal prophylaxis. Pantoprazole. 7.  Deep vein thrombosis prophylaxis with SCDs.  8.  Neutropenic precautions.   CODE STATUS: The patient is a full code.   TIME SPENT ON ADMISSION ORDERS AND PATIENT CARE: Approximately 35 minutes.    ____________________________ Norva Riffle. Marcille Blanco, MD msd:cg D: 12/14/2013 00:52:25 ET T: 12/14/2013 02:40:48 ET JOB#: [id number]  cc: Norva Riffle. Marcille Blanco, MD, <Dictator> Norva Riffle Angelli Baruch MD ELECTRONICALLY SIGNED 12/14/2013 23:48

## 2014-09-04 NOTE — Consult Note (Signed)
History of Present Illness:  Reason for Consult Acute leukemia status post chemotherapy, now with neutropenic fever.   HPI   Patient is a 34 year old male with no significant past medical history who was recently diagnosed with acute leukemia. He received his induction chemotherapy at [facility] completing his treatment on Saturday, December 05, 2013. He presented to the emergency room with increasing fevers, weakness and fatigue, and found to be pancytopenic. Currently he feels improved from admission, but not back to baseline.   He has no neurologic complaints. He has a fair appetite. He has no chest pain or shortness of breath. He denies any cough or hemoptysis. He denies any nausea, vomiting, constipation, or diarrhea. He has no urinary complaints. Patient otherwise feels well and offers no further specific complaints.  PFSH:  Additional Past Medical and Surgical History negative.  Family history: Negative and noncontributory.  Social history: Patient denies tobacco or alcohol.   Review of Systems:  Performance Status (ECOG) 2   Review of Systems   As per HPI. Otherwise, 10 point system review was negative.   NURSING NOTES: **Vital Signs.:   03-Aug-15 16:00   Vital Signs Type: Blood Transfusion   Temperature Temperature (F): 102.5   Celsius: 39.1   Temperature Source: oral   Pulse source if not from Vital Sign Device: per cardiac monitor   Oxygen Delivery: Room Air/ 21 %   Physical Exam:  Physical Exam General: ill-appearing, no acute distress. Eyes: Pink conjunctiva, anicteric sclera. HEENT: Normocephalic, moist mucous membranes, clear oropharnyx. Lungs: Clear to auscultation bilaterally. Heart: Regular rate and rhythm. No rubs, murmurs, or gallops. Abdomen: Soft, nontender, nondistended. No organomegaly noted, normoactive bowel sounds. Musculoskeletal: No edema, cyanosis, or clubbing. Neuro: Alert, answering all questions appropriately. Cranial nerves grossly  intact. Skin: No rashes or petechiae noted. Psych: Normal affect.    Prochlorperazine: Anxiety, Agitation    pantoprazole 40 mg oral delayed release tablet: 1 tab(s) orally 2 times a day, Status: Active, Quantity: 0, Refills: None   Zofran 8 mg oral tablet:  orally every 8 hours, As Needed - for Nausea, Vomiting, Status: Active, Quantity: 0, Refills: None  Laboratory Results: Routine Chem:  03-Aug-15 09:26   Result Comment LABS - This specimen was collected through an   - indwelling catheter or arterial line.  - A minimum of 54ms of blood was wasted prior    - to collecting the sample.  Interpret  - results with caution. WBC/PLATELETS - RESULTS VERIFIED BY REPEAT TESTING.  - CRITICAL VALUE PREVIOUSLY NOTIFIED. DIFFERENTIAL - DUE TO THE LOW WBC, THE INSTRUMENT DIFF  - CANNOT BE CONFIRMED BY MANUAL DIFF AND  - IS REPORTED PRIMARILY TO IDENTIFY CELL  - TYPES PRESENT.  Result(s) reported on 14 Dec 2013 at 11:24AM.  Glucose, Serum  101  BUN 8  Creatinine (comp) 0.98  Sodium, Serum 137  Potassium, Serum  3.3  Chloride, Serum 105  CO2, Serum 26  Calcium (Total), Serum  8.2  Anion Gap  6  Osmolality (calc) 272  eGFR (African American) >60  eGFR (Non-African American) >60 (eGFR values <69mmin/1.73 m2 may be an indication of chronic kidney disease (CKD). Calculated eGFR is useful in patients with stable renal function. The eGFR calculation will not be reliable in acutely ill patients when serum creatinine is changing rapidly. It is not useful in  patients on dialysis. The eGFR calculation may not be applicable to patients at the low and high extremes of body sizes, pregnant women, and vegetarians.)  Routine  Hem:  03-Aug-15 09:26   WBC (CBC)  0.3  RBC (CBC)  2.06  Hemoglobin (CBC)  6.6  Hematocrit (CBC)  18.2  Platelet Count (CBC)  16  MCV 89  MCH 32.2  MCHC  36.3  RDW  16.0  Neutrophil % 2.1  Lymphocyte % 80.7  Monocyte % 10.2  Eosinophil % 7.0  Basophil % 0.0   Neutrophil #  0.0  Lymphocyte #  0.3  Monocyte #  0.0  Eosinophil # 0.0  Basophil # 0.0   Assessment and Plan: Impression:   acute leukemia, neutropenic fever, pancytopenia. Plan:   1. Acute leukemia: Patient completed his chemotherapy on December 05, 2013 at [facility]. His primary oncologist at [facility] has been consulted and is aware of his current admission.Anemia: Secondary to chemotherapy and leukemia. Patient's hemoglobin has dropped below 7.0 and will require one unit of packed red blood cells. Continue to monitor daily CBC and transfuse if you have and remains below 7.0.Thrombocytopenia: Secondary chemotherapy. Patient received platelets yesterday with improvement of his platelet count is greater than 10. Continue to monitor daily and transfuse again if platelets drop below 10.Neutropenia: Secondary chemotherapy, monitor. Expect patient's white blood cell count to improve over the next 1-5 days.Fevers: Cultures negative to date, continue empiric antibiotics until patient is afebrile for 24 hours and his neutropenia resolves. consult, will follow.  Electronic Signatures: Delight Hoh (MD)  (Signed 03-Aug-15 16:48)  Authored: HISTORY OF PRESENT ILLNESS, PFSH, ROS, NURSING NOTES, PE, ALLERGIES, HOME MEDICATIONS, LABS, ASSESSMENT AND PLAN   Last Updated: 03-Aug-15 16:48 by Delight Hoh (MD)

## 2014-09-04 NOTE — Discharge Summary (Signed)
PATIENT NAME:  Alexander Levine, Alexander Levine MR#:  [id number] DATE OF BIRTH:  03-Aug-1980  DATE OF ADMISSION:  08/01/2013 DATE OF DISCHARGE:  08/03/2013  ADMITTING DIAGNOSIS: Anemia.   DISCHARGE DIAGNOSES: 1.  Acute leukemia with anemia as well as thrombocytopenia, status post 3 units of packed red blood cell transfusion with hemoglobin level of 7.7 today, on the 23rd of March 2015, with platelet count of 48,000. 2.  Fever of unknown origin. Blood cultures are negative since 21st of March 2015. 3.  Upper abdominal pain with questionable diagnosis of bowel infection in January 2015. 4.  Anxiety, likely adjustment reaction.  5.  Leukocytosis.  6.  Elevated transaminases of unclear etiology.   DISCHARGE CONDITION: Stable.   DISCHARGE MEDICATIONS: Alprazolam 0.25 mg every 8 hours as needed, zolpidem 5 mg p.o. at bedtime as needed, ceftazidime 2 grams every 8 hours IV, and pantoprazole 40 mg IV every 12 hours.   HOME OXYGEN: None.   DIET: Regular, regular consistency.   ACTIVITY LIMITATIONS: As tolerated.    FOLLOWUP APPOINTMENT: With primary care physician who is none in the next few days after discharge.   CONSULTANTS: Dr. Allen Norris, Dr. Kallie Edward.  RADIOLOGIC STUDIES: CT scan of abdomen and pelvis with contrast, 21st of March 2015, revealing interval improvement of enteritis seen on comparison exam. Interval increase in intraperitoneal free fluid within the pelvis. This presumably may be related to inflammatory bowel disease, but no bowel inflammation was present on current exam. The patient may benefit from formal GI consultation. Mild thickening of the distal stomach suggesting gastritis, which can be associated with inflammatory bowel disease. Again, recommend GI consultation. New bandlike atelectasis in the right middle lobe.   HISTORY AND HOSPITAL COURSE: The patient is a 34 year old Caucasian male with past medical history significant for history of some kind of bowel infection diagnosed at [facility] in  January 2015 where he presented with upper abdominal pain, comes to the hospital with complaints of abdominal pain. Please refer to Dr. Gardiner Coins admission note on the 21st of March 2015. On arrival to the Emergency Room, the patient was noted to have high fevers to 101. CT scan did not show any acute findings. He was however found to be having significant anemia with hemoglobin level of 4.8 and hematocrit of 13.6 and platelet count of 59,000. He admitted to taking nonsteroidal anti-inflammatory medications on a daily basis due to abdominal pain. He denied any alcohol use. He admitted of smoking. He used to smoke in the past, however, he quit a few months ago.  On arrival to the Emergency Room, the patient's temperature was 101.7, pulse was 77, respiration rate was 20, blood pressure 114/66, and saturation was 100% on room air. Physical examination was unremarkable, except of pallor.   The patient's lab data done on admission, on the 21st of March 2015, revealed elevated glucose level to 131, otherwise BMP was normal. The patient's iron studies revealed iron level of 82, iron binding capacity was 230, unbound iron binding capacity was 148. Iron saturation was 36. Ferritin level was 2568. Lipase level was 55. Liver enzymes were remarkable for elevated AST to 51 and albumin level of 2.8. CBC: White blood cell count was 13.8, hemoglobin was 4.8, and platelet count was 59,000. The patient's CBC also revealed anisocytosis, smudge cells as well as platelets that are varied in size. The patient's blood cultures taken on 21st of March 2015 were negative through his stay in the hospital time. Urinalysis was unremarkable. CT of abdomen revealed some  thickening of his distal stomach, otherwise, no significant abnormalities were found.   The patient was admitted to the hospital for further evaluation. He was started on broad-spectrum antibiotic therapy. Consultation with gastroenterologist as well as oncologist was obtained.  The patient had flow cytometry done on the 22nd of March 2015 which came back positive for acute leukemia of unknown type. During his stay in the hospital time, the patient required 3 units of packed red blood cell transfusion after which his hemoglobin level improved to 7.7 on the 23rd of March 2015. His platelet count remained somewhat stable with level of 48,000 on the day of discharge. Other lab studies: The patient's LDH was elevated at 798. Direct Coombs was negative. Blood cultures taken on the 21st of March 2015, as mentioned above, were negative as well. The patient is being discharged to [facility] by recommendations of Dr. Kallie Edward. Dr. Jeanella Flattery is going to be the accepting physician. The patient was seen by gastroenterologist, Dr. Allen Norris, who felt that the patient presents with anemia of chronic disease as well as heme positive stools with normal iron levels. The patient's past imaging has shown findings consistent with possible IBD, however, the patient may need to have EGD done as well as colonoscopy done for heme positive stools as well as abdominal pain in the future, according gastroenterologist, Dr. Allen Norris. The patient's discharge vital signs: Temperature was 98.9, pulse was 69, respiratory rate was 18, blood pressure 112/65, and saturation was 99% on room air at rest.   TIME SPENT: 40 minutes.  ____________________________ Theodoro Grist, MD rv:sb D: 08/03/2013 10:19:44 ET T: 08/03/2013 10:48:29 ET JOB#: [id number]  cc: Theodoro Grist, MD, <Dictator> Meleni Delahunt MD ELECTRONICALLY SIGNED 08/17/2013 12:45

## 2014-09-04 NOTE — Consult Note (Signed)
Brief Consult Note: Diagnosis: Acute Leukemia.   Patient was seen by consultant.   Comments: Discussed with pathology this am. ON slide review this is acute leukemia with 20% blasts. I have discussed this with Dr Janese Banks at [facility] and patient will be transferred to [facility] today. Also discussed with Theme park manager. Patient and family notified that this is acute leukemia which will require intensive chemotherapy and possibly transplant which will best be done at a tertiary facility Will transfer to [facility] today.  Electronic Signatures: Georges Mouse (MD)  (Signed 23-Mar-15 09:47)  Authored: Brief Consult Note   Last Updated: 23-Mar-15 09:47 by Georges Mouse (MD)

## 2014-09-04 NOTE — H&P (Signed)
PATIENT NAME:  Alexander, Levine MR#:  [id number] DATE OF BIRTH:  1980-10-01  DATE OF ADMISSION:  08/01/2013   PRIMARY CARE PHYSICIAN: None.   CHIEF COMPLAINT: Abdominal pain.   HISTORY OF PRESENT ILLNESS: Very pleasant 34 year old male who was seen at [facility] in January for abdominal pain, was diagnosed with bowel infection and asked to follow up with GI, which he has an appointment in April, he presents again with increasing abdominal pain and fevers. In the ER, he had a temperature of 101. He and had a CT scan of the abdomen without any acute findings. He also was noted to have significant anemia of hemoglobin of 4.8 and hematocrit 13.6 with platelet count of 59,000. He said he is taking NSAIDs on a daily basis due to the abdominal pain. He does not drink alcohol. He used to smoke but quit several months ago.   REVIEW OF SYSTEMS: CONSTITUTIONAL:  Positive fever. Positive fatigue and weakness.  EYES: No blurred or double vision, glaucoma, cataracts.   HEENT: No ear pain, hearing loss, seasonal allergies, postnasal drip. No sinus pain.  RESPIRATORY: No cough, wheezing, hemoptysis. Positive dyspnea. No asthma, painful respirations.  CARDIOVASCULAR: No chest pain, orthopnea, palpitations, syncope, edema, arrhythmia. Positive dyspnea on exertion.  GASTROINTESTINAL: No nausea, vomiting, diarrhea. Positive abdominal pain. No melena, ulcers or GERD. No hematemesis.  GENITOURINARY: No dysuria or hematuria.  ENDOCRINE: No POLYURIA HEMATOLOGY AND LYMPHATICS: Positive easy bruising. No bleeding. SKIN: No rash or lesions.  MUSCULOSKELETAL: NoWEAKNESS ARTHRITIS NEUROLOGICAL: NO  CVA, transient ischemic attack or seizures.  PSYCHIATRIC: NO  anxiety or depression.   PAST MEDICAL HISTORY: None.  MEDICATIONS: The patient is taking NSAIDs as needed.   ALLERGIES: No known drug allergies.  PAST SURGICAL HISTORY: None.   SOCIAL HISTORY: The patient quit smoking several months ago. No alcohol or IV drug  use.   FAMILY HISTORY: Positive for hypertension.   PHYSICAL EXAMINATION: VITAL SIGNS: Temperature 101.7, pulse 77, respirations 20, blood pressure 114/66, 100% on room air.  GENERAL: The patient is alert, oriented, not in acute distress. The patient looks very pale.  HEENT: Head is atraumatic. Pupils are reactive. Sclerae are intact. Mucous membranes are moist. Oropharynx: Clear.  NECK: Supple without JVD, carotid bruit or enlarged thyroid.   CARDIOVASCULAR: Regular rate and rhythm. No murmurs, gallops or rubs. PMI is not displaced.  LUNGS: Clear to auscultation without crackles, rales, rhonchi or wheezing. Normal percussion.  ABDOMEN: Bowel sounds are positive, hypoactive. No rebound or guarding but he has got diffuse tenderness throughout.  BACK: No CVA or vertebral tenderness.  EXTREMITIES: No clubbing, cyanosis or edema. NEUROLOGICAL: Cranial nerves 2 k 12 are intact. There are no focal deficits.   SKIN: Pale but no rashes or lesions noted.   LABORATORIES: White blood cells 13.8, hemoglobin 4.8, hematocrit 13.6, and platelets are 59,000. Lipase is 55. Sodium 136, potassium 4.2, chloride 101, bicarbonate 29, BUN 16, creatinine 1.19, glucose 131, calcium 8.7, bilirubin 0.4, alkaline phosphatase 68, ALT 65, AST 51, total protein 7.9, albumin is 2.8. Urinalysis shows no LCE or nitrites.   CT of the abdomen and pelvis: Interval improvement in enteritis seen on comparison Examination, interval increase in intraperitoneal free fluid within the pelvis. Presumably may be related to inflammatory bowel disease. No bowel inflammation is present on current examination, mild thickening in the distal stomach suggesting gastritis. New band-like atelectasis in the right middle lobe.   ASSESSMENT AND PLAN: A 34 year old male who presents with increasing abdominal pain, found to have  a hemoglobin of 4.8 and hematocrit 13.6.   1. Acute anemia, symptomatic. The patient does complain of weakness, fatigue and  dyspnea on exertion. The patient has consented for 2 units of PRBCs. The patient has been taking NSAIDs. He could have an ulcer or this could be a chronic bleed from possible underlying inflammatory bowel disease as suggested by the CT scan. I have placed the patient on PPI IV twice a day. I have consulted GI and I am calling them now to discuss the patient's case. We will order iron studies.  2. Abdominal pain, possibly related to inflammatory bowel disease or possible celiac sprue or even related to an ulcer. The patient has no acute findings on abdominal CT, does have a fever and as well as an elevated white count so for now, I will empirically place him on antibiotics. GI consultation will follow the patient clinically. Again, there are no acute findings such as a perforation on abdominal CT scan.  3. Leukocytosis, possibly related to inflammatory changes. I have placed the patient on empiric antibiotics with ciprofloxacin and Flagyl for now.  4. Thrombocytopenia of unclear etiology. The patient does have anemia, but no active GI bleeding. At this time, I would not provide platelets but before procedure, like an EGD, I suspect the patient will need some platelets and so would suggest ordering platelets tomorrow or prior to GI evaluation with endoscopy or colonoscopy. Again, I will contact the GI physician to discuss this case and any other plans.   CODE STATUS: FULL CODE.   The patient will need a PCP prior to discharge.   TIME SPENT: Approximately 50 minutes.    ____________________________ Donell Beers. Benjie Karvonen, MD [id number] D: 08/01/2013 18:35:09 ET T: 08/01/2013 19:36:42 ET JOB#: [id number]  cc: Ric Rosenberg P. Benjie Karvonen, MD, <Dictator> Donell Beers Aubrionna Istre MD ELECTRONICALLY SIGNED 08/01/2013 20:34

## 2014-09-04 NOTE — Consult Note (Signed)
History of Present Illness:  Reason for Consult anemia,leukocytosis and fever   HPI   HISTORY OF PRESENT ILLNESS: Very pleasant 34 year old male who was seen at [facility] in January for abdominal pain, was diagnosed with bowel infection and asked to follow up with GI, which he has an appointment in April, he presents again with increasing abdominal pain and fevers. In the ER, he had a temperature of 101. He and had a CT scan of the abdomen without any acute findings. He also was noted to have significant anemia of hemoglobin of 4.8 and hematocrit 13.6 with platelet count of 59,000. He said he is taking NSAIDs on a daily basis due to the abdominal pain. He does not drink alcohol. He used to smoke but quit several months ago.  NO weight loss. No nausea,vomiting or diarrhea. No travel history  PFSH:  Family History noncontributory   Social History negative alcohol, negative tobacco, quit several months ago   Review of Systems:  General fever  fatigue   Performance Status (ECOG) 1   HEENT no complaints   Lungs no complaints   Cardiac no complaints   GI pain   Musculoskeletal no complaints   Extremities no complaints   Skin no complaints   Neuro no complaints   Endocrine no complaints   Psych no complaints   NURSING NOTES: ED Vital Sign Flow Sheet:   21-Mar-15 18:54   Vital Signs Type: 1 hr Post Blood   Pulse Pulse: 77   Respirations Respirations: 20   SBP SBP: 114   DBP DBP: 59   Pulse Ox % Pulse Ox %: 100   Pulse Ox Source Source: Room Air   Pain Scale (0-10) Pain Scale (0-10): Scale:0   Telemetry pattern Cardiac Rhythm: Normal sinus rhythm   Physical Exam:  General awake,alert well nourished   HEENT: normal   Lungs: clear   Cardiac: regular rate, rhythm   Breast: not examined   Abdomen: soft  nontender  positive bowel sounds   Skin: intact   Extremities: No edema, rash or cyanosis   Neuro: AAOx3   Psych: normal appearance    No Known  Allergies:   Assessment and Plan: Impression:   34 YEar old male with fevers and leucoytosis with 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 Flow cytometry pending. 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 Discussed with pathologist today. On further review this is acute leukemia with 20% blasts. Patient will be transferred to [facility] center for further workup and intensive chemotherapy once diagnosis and type of leukemia confirmed.with patient and family.  Electronic Signatures: Georges Mouse (MD)  (Signed 23-Mar-15 09:54)  Authored: HISTORY OF PRESENT ILLNESS, PFSH, ROS, NURSING NOTES, PE, ALLERGIES, HOME MEDICATIONS, ASSESSMENT AND PLAN   Last Updated: 23-Mar-15 09:54 by Georges Mouse (MD)

## 2014-09-04 NOTE — Discharge Summary (Signed)
PATIENT NAME:  Alexander Levine, Alexander Levine MR#:  [id number] DATE OF BIRTH:  06-19-1980  DATE OF ADMISSION:  12/14/2013 DATE OF DISCHARGE:  12/16/2013  PRIMARY CARE PHYSICIAN: Dr. Janese Banks at [facility] oncology.    PRIMARY ADMITTING DIAGNOSES:  1.  Neutropenic fever.  2.  Acute myelocytic leukemia. 3.  Electrolyte abnormalities.   PRIMARY DISCHARGE DIAGNOSES:  1.  Neutropenic fever is resolved. Blood and urine cultures were negative.  2.  Pancytopenia including neutropenia is resolved.  3.  Anemia, status post 2 units of blood transfusion.  4.  Thrombocytopenia, status post 1 unit of single donor platelet transfusion.  5.  Electrolyte abnormalities with hypomagnesemia and hypophosphatemia, repleted.  SECONDARY DISCHARGE DIAGNOSES:  Acute myelocytic leukemia. The patient is recommended to follow up with Dr. Janese Banks, his oncologist, as an outpatient in 2 days.   CONSULTATIONS:  A local group is consulted.   PROCEDURES: None.   BRIEF HISTORY AND HOSPITAL COURSE: The patient is a 34 year old pleasant Caucasian male with past medical history of acute myelocytic leukemia, just had his chemotherapy 1 week ago, but came into the ED with a chief complaint of fever. The patient was diagnosed with neutropenic fever. At that time, he had non-bilious, non-bloody vomiting. The patient was admitted to CCU as his platelet count is at 3. The patient has received 1 dose of IV cefepime, and IV Zosyn and vancomycin were initiated.   The patient was placed on neutropenic precautions. Blood cultures and urine cultures were followed subsequently, which were negative.  IV Zosyn and IV vancomycin was continued. Prior to thrombocytopenia, 1 unit of single donor platelets were given.  Subsequently, his platelet count was monitored, which was at around 10 to 11, and today it went up to 20.  For anemia, 3 units of blood transfusion were given. The patient's hemoglobin is 8.3 today. Subsequently, neutropenia is resolved and  white blood count went up to 1.0. The patient's body cramps were also resolved. Cultures turned out to be negative. Electrolyte abnormalities were corrected with IV magnesium. Hyperphosphatemia is also corrected. Overall, his clinical condition significantly improved. As per my discussion with his [facility] oncologist Dr. Janese Banks, as he is clinically stable, she has recommended to discharge.  The patient home if he is afebrile for 24 hours. The patient was also evaluated by local oncologist, Dr. Inez Pilgrim, who has recommended to keep his hemoglobin greater than 7 and platelet count is greater than 10 and provide transfusions on an as needed basis.   Today, as per my discussion with Dr. Grayland Ormond, the on-call oncologist, with the patient's hemoglobin being greater than 7 and white count 1.0, decision was made to discharge the patient home with p.o. antibiotics levofloxacin for the next 5 days. Also, patient was recommended to follow up with his oncologist in 1- 2 days.   CONDITION: At the time of discharge, stable.   DISPOSITION: Home.   DISCHARGE MEDICATIONS: Pantoprazole 40 mg p.o. 2 times a day, Zofran 8 mg 1 tablet p.o. every 8 hours as needed for nausea and vomiting, Tylenol 325 mg 2 tablets p.o. every 4 hours as needed for mild pain, Levaquin 500 mg 1 tablet p.o. every 24 hours for the next 5 days, oxycodone 5 mg 1 tablet p.o. every 4-6 hours as needed for moderate to severe pain.   DIET:  Regular.  ACTIVITY: As tolerated.   The patient was advised to use mask in public places.   Follow up his primary oncologist Dr. Janese Banks at [facility]  in 1-2 days.   SIGNIFICANT LABS AND IMAGING STUDIES: Blood cultures and urine cultures have revealed no growth. Today's white count is at 1.0, hemoglobin 8.3, hematocrit 23.4, platelets are at 20.    The plan of care was discussed in detail with the patient. He verbalized understanding of the plan.   TOTAL TIME SPENT ON THE DISCHARGE: 45 minutes.     ____________________________ Nicholes Mango, MD ag:ts D: 12/16/2013 14:16:25 ET T: 12/16/2013 14:28:13 ET JOB#: [id number]  cc: Nicholes Mango, MD, <Dictator> Dr. Janese Banks at [place] MD ELECTRONICALLY SIGNED 12/24/2013 8:14

## 2015-08-08 IMAGING — CT CT ABD-PELV W/ CM
2 of 4 series · 15 of 46 positions shown, 17 images · IV contrast (isovue)
Comparison: CT 05/19/2013, is.

CLINICAL DATA: Abdominal pain and cramping. Weight loss 100 mL
Isovue

EXAM:
CT ABDOMEN AND PELVIS WITH CONTRAST
TECHNIQUE: Multidetector CT imaging of the abdomen and pelvis was performed
using the standard protocol following bolus administration of
intravenous contrast.
CONTRAST:  100 mL Isovue

[Series 2: routine abd pel with · axial · 0.65mm/px · z∈[-527,-112]mm · 12 of 91 slices shown, 14 images]
[im 4/91  soft-tissue]
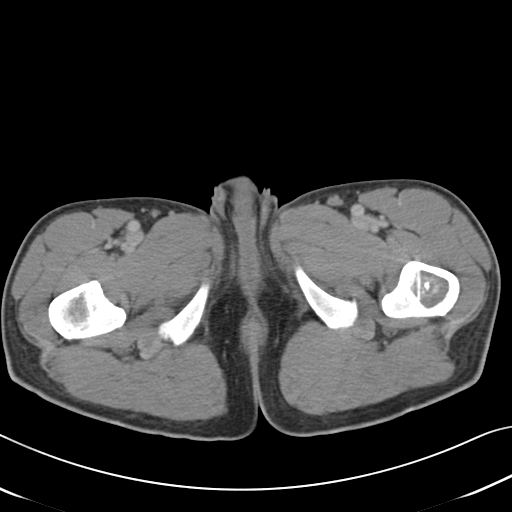
[im 4/91  bone]
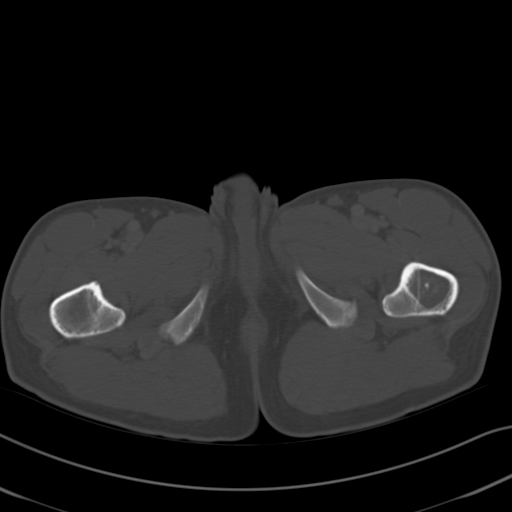
[im 12/91  soft-tissue]
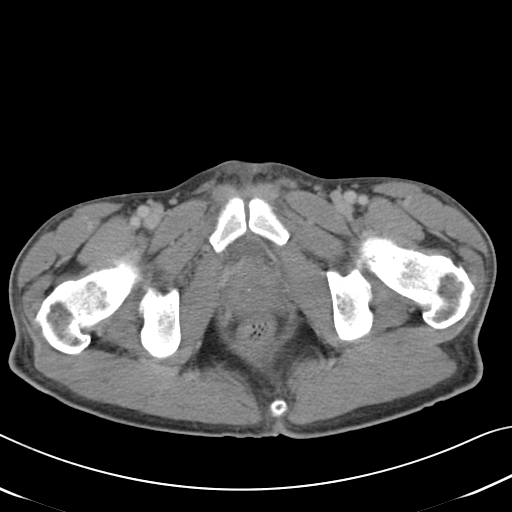
[im 19/91  soft-tissue]
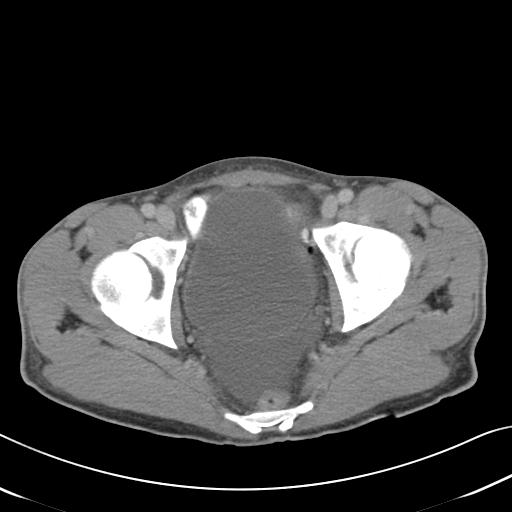
[im 27/91  soft-tissue]
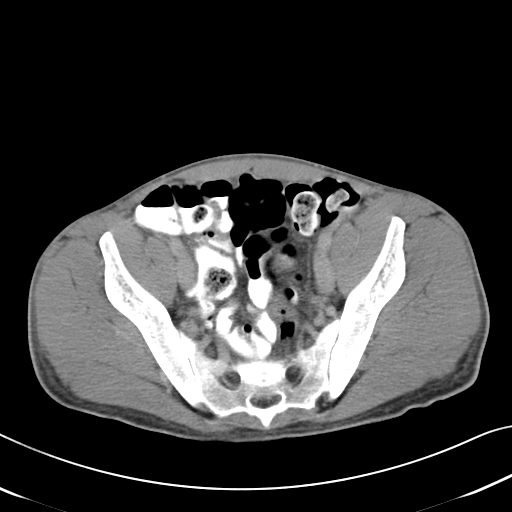
[im 34/91  soft-tissue]
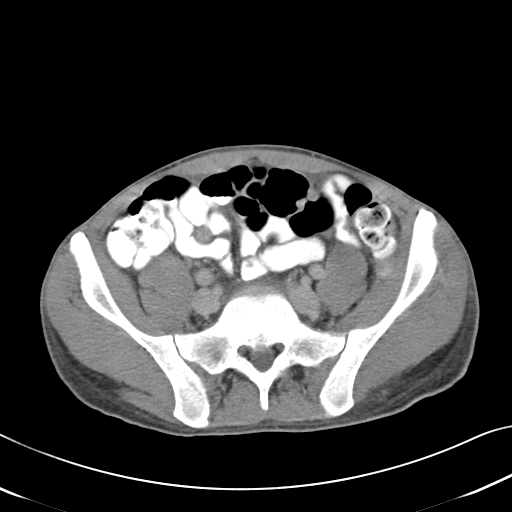
[im 42/91  soft-tissue]
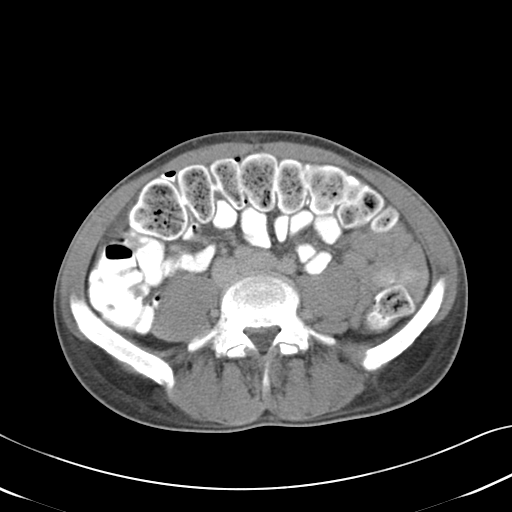
[im 49/91  soft-tissue]
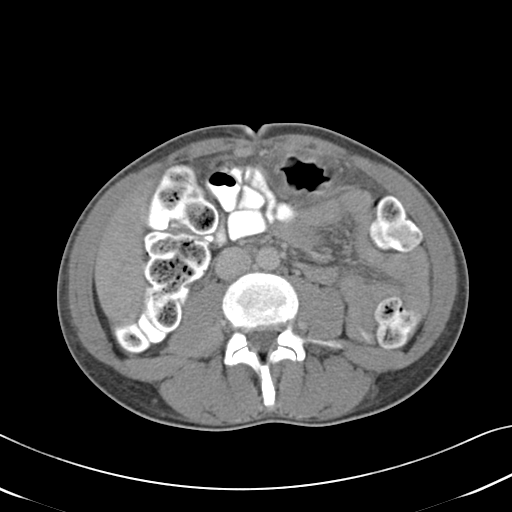
[im 57/91  soft-tissue]
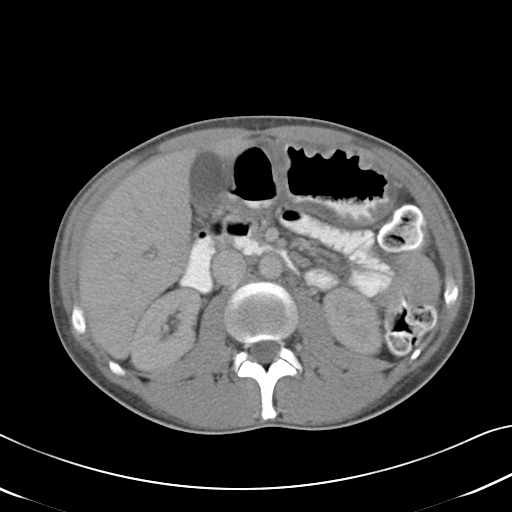
[im 64/91  soft-tissue]
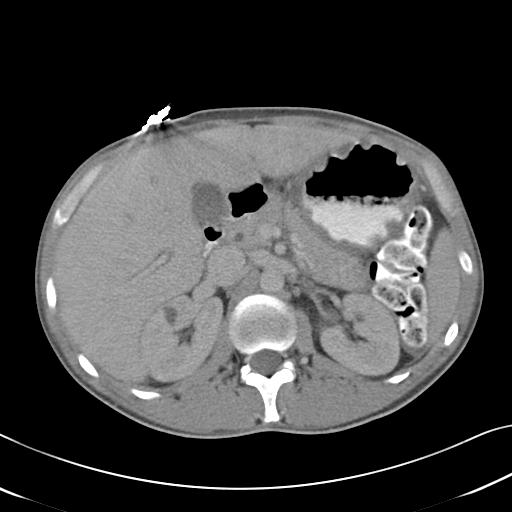
[im 64/91  bone]
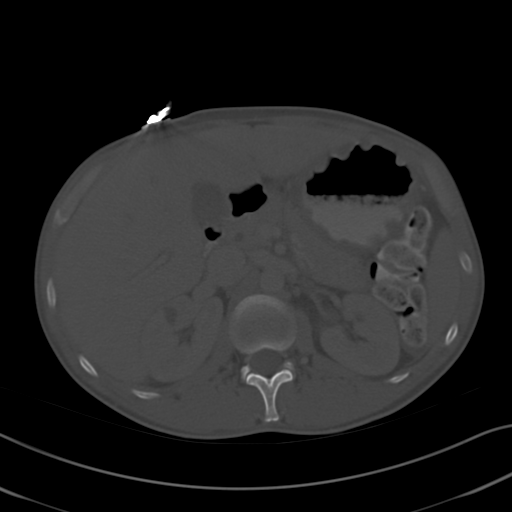
[im 72/91  soft-tissue]
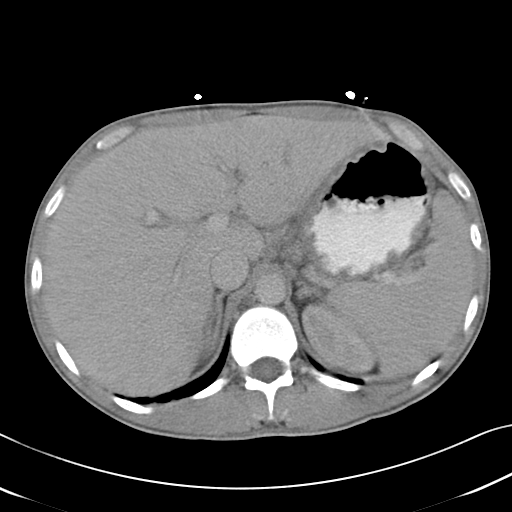
[im 79/91  soft-tissue]
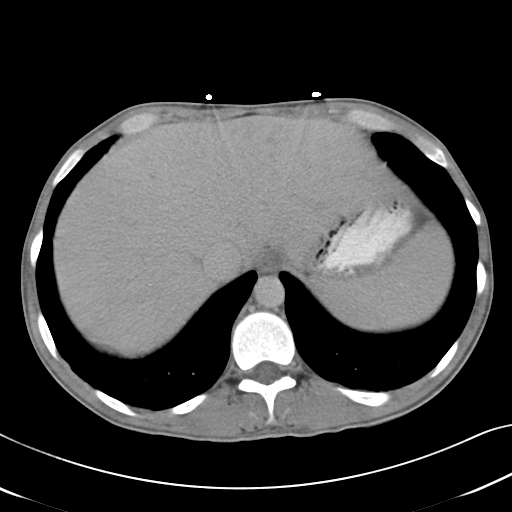
[im 87/91  soft-tissue]
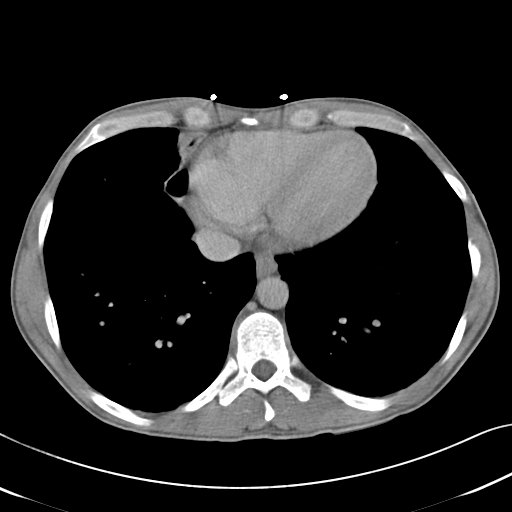

[Series 5: cor routine abd pel with · coronal · 0.59mm/px · 3 of 109 slices shown]
[im 37/109  soft-tissue]
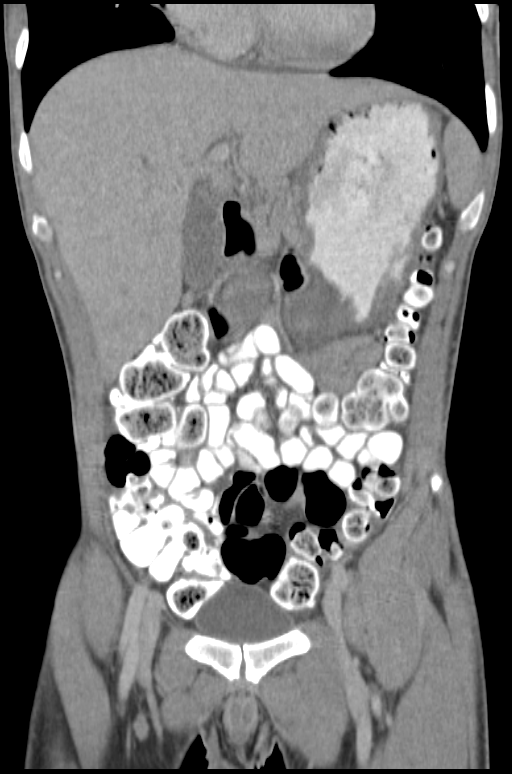
[im 49/109  soft-tissue]
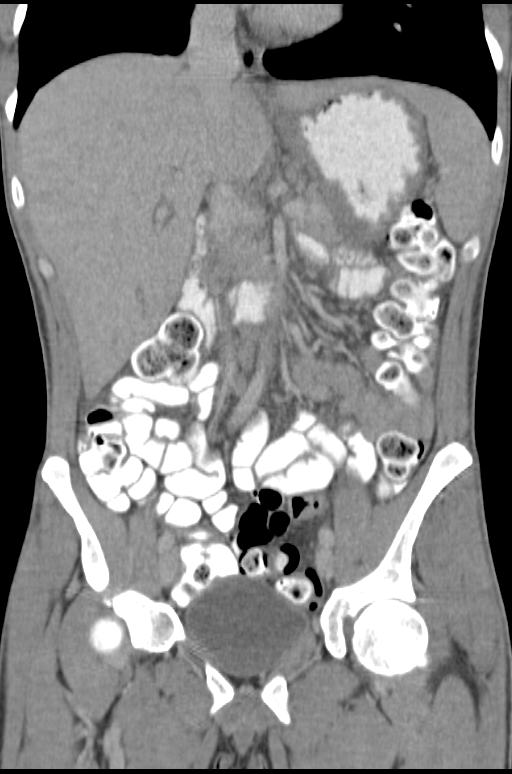
[im 61/109  soft-tissue]
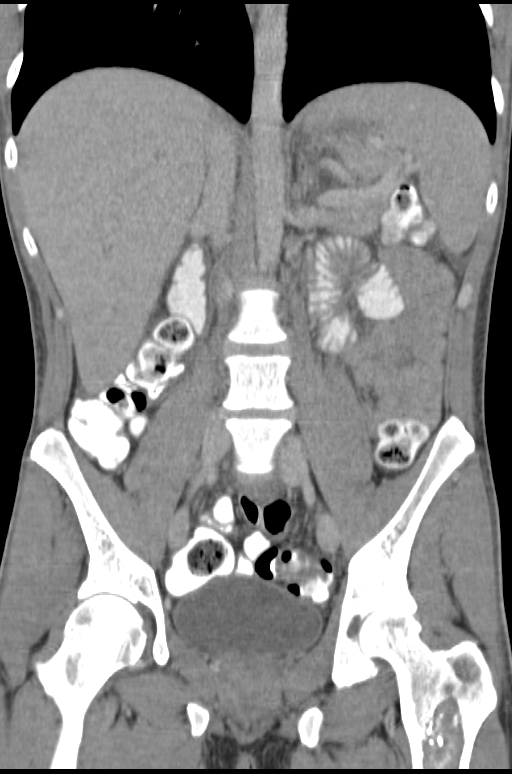

[15 of 46 positions shown; findings below may reference images not displayed]

FINDINGS: There is bandlike atelectasis in the right middle lobe. Small
benign. Pulmonary nodules are present over the left hemidiaphragm
(image number 10 concern for no pericardial fluid.

No focal hepatic lesion. Gallbladder, pancreas, spleen, adrenal
glands are normal. Is nonenhancing cysts within the right kidney.

There is circumferential thickening of the gastric wall through the
body may intra region measuring up to 10 mm which is increased
compared to prior. This a nonspecific finding. The duodenum appears
normal. . The small bowel inflammation seen on comparison CT. Sharp
resolved. Difficult to evaluate the intraperitoneal contents to 2
small amount of intraperitoneal fat. Contrast does flow the entirety
of the bowel to the descending colon. Terminal ileum appears normal.
Appendix is partially imaged and appears normal. Moderate volume of
stool fluid the colon No evidence of chronic inflammation.

Abdominal aorta is normal caliber. No retroperitoneal periportal
lymphadenopathy.

Small amount free fluid within the deep posterior pelvis is
increased compared to prior. Bladder is normal. Tc glands normal. No
pelvic lymphadenopathy. No aggressive osseous lesion.
IMPRESSION: 1. Interval improvement in the enteritis seen on comparison exam.
2. Interval increase in intraperitoneal free fluid within the
pelvis. This presumably may be related to inflammatory bowel disease
but no bowel inflammation is present on current exam. Patient may
benefit from a formal GI consultation.
3. Mild thickening of the distal stomach suggesting gastritis which
can be associated with inflammatory bowel disease. Again recommend
GI consultation.
4. New bandlike atelectasis the right middle lobe.

## 2018-05-11 ENCOUNTER — Other Ambulatory Visit: Payer: Self-pay

## 2018-05-11 ENCOUNTER — Ambulatory Visit (HOSPITAL_COMMUNITY)
Admission: EM | Admit: 2018-05-11 | Discharge: 2018-05-11 | Disposition: A | Discharge status: Home / Self Care | Payer: Managed Care, Other (non HMO) | Attending: Family Medicine | Admitting: Family Medicine

## 2018-05-11 ENCOUNTER — Encounter (HOSPITAL_COMMUNITY): Payer: Self-pay | Admitting: *Deleted

## 2018-05-11 DIAGNOSIS — J111 Influenza due to unidentified influenza virus with other respiratory manifestations: Secondary | ICD-10-CM | POA: Diagnosis not present

## 2018-05-11 HISTORY — DX: Acute myeloblastic leukemia, not having achieved remission: C92.00

## 2018-05-11 MED ORDER — ONDANSETRON 8 MG PO TBDP
8.0000 mg | ORAL_TABLET | Freq: Three times a day (TID) | ORAL | 0 refills | Status: AC | PRN
Start: 2018-05-11 — End: ?

## 2018-05-11 MED ORDER — ONDANSETRON 4 MG PO TBDP
ORAL_TABLET | ORAL | Status: AC
  Filled 2018-05-11: qty 1

## 2018-05-11 MED ORDER — ONDANSETRON 4 MG PO TBDP
4.0000 mg | ORAL_TABLET | Freq: Once | ORAL | Status: AC
  Administered 2018-05-11: 4 mg via ORAL

## 2018-05-11 MED ORDER — OSELTAMIVIR PHOSPHATE 75 MG PO CAPS: 75.0000 mg | ORAL_CAPSULE | Freq: Two times a day (BID) | ORAL | 0 refills | Status: AC

## 2018-05-11 NOTE — ED Provider Notes (Signed)
[place]    CSN: [id number] Arrival date & time: 05/11/18  1725     History   Chief Complaint Chief Complaint  Patient presents with  . Emesis  . Cough    HPI Alexander Levine is a 37 y.o. male.   This is the initial [facility] urgent care visit for this 37 year old man.  Reports starting yesterday with cough, congestion, body aches, hoarse voice, nausea.  Vomiting today.  Unsure if fevers.  Patient is in remission from leukemia.  He has his final blood tests in 2 months.  Patient is also on Suboxone therapy.  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  He is accompanied by his wife.     Past Medical History:  Diagnosis Date  . AML (acute myeloblastic leukemia) ([place])     There are no active problems to display for this patient.   History reviewed. No pertinent surgical history.     Home Medications    Prior to Admission medications   Medication Sig Start Date End Date Taking? Authorizing Provider  Buprenorphine HCl-Naloxone HCl (SUBOXONE SL) Place under the tongue.   Yes [provider]  Multiple Vitamin (MULTIVITAMIN WITH MINERALS) TABS tablet Take 1 tablet by mouth daily.    [provider]  ondansetron (ZOFRAN-ODT) 8 MG disintegrating tablet Take 1 tablet (8 mg total) by mouth every 8 (eight) hours as needed for nausea. 05/11/18   Robyn Haber, MD  oseltamivir (TAMIFLU) 75 MG capsule Take 1 capsule (75 mg total) by mouth every 12 (twelve) hours. 05/11/18   Robyn Haber, MD    Family History History reviewed. No pertinent family history.  Social History Social History   Tobacco Use  . Smoking status: Former Smoker    Types: Cigarettes  . Smokeless tobacco: Never Used  Substance Use Topics  . Alcohol use: No  . Drug use: No     Allergies   Other   Review of Systems Review of Systems   Physical Exam Triage Vital Signs ED Triage Vitals  Enc Vitals Group     BP 05/11/18 1819 105/64     Pulse  Rate 05/11/18 1819 81     Resp 05/11/18 1819 20     Temp 05/11/18 1819 99.9 F (37.7 C)     Temp Source 05/11/18 1819 Oral     SpO2 05/11/18 1820 100 %     Weight --      Height --      Head Circumference --      Peak Flow --      Pain Score 05/11/18 1820 2     Pain Loc --      Pain Edu? --      Excl. in [place]? --    No data found.  Updated Vital Signs BP 105/64   Pulse 81   Temp 99.9 F (37.7 C) (Oral)   Resp 20   SpO2 100%    Physical Exam Vitals signs and nursing note reviewed.  Constitutional:      General: He is not in acute distress.    Appearance: He is normal weight. He is not toxic-appearing or diaphoretic.  HENT:     Head: Normocephalic and atraumatic.     Right Ear: Tympanic membrane and external ear normal.     Left Ear: Tympanic membrane and external ear normal.     Nose: Nose normal.     Mouth/Throat:     Mouth: Mucous membranes are moist.  Pharynx: Oropharynx is clear.  Eyes:     Conjunctiva/sclera: Conjunctivae normal.     Pupils: Pupils are equal, round, and reactive to light.  Neck:     Musculoskeletal: Normal range of motion and neck supple.  Cardiovascular:     Rate and Rhythm: Normal rate.     Heart sounds: Normal heart sounds.  Pulmonary:     Effort: Pulmonary effort is normal.     Breath sounds: Normal breath sounds.  Abdominal:     General: Bowel sounds are normal.     Palpations: Abdomen is soft.  Musculoskeletal: Normal range of motion.  Skin:    General: Skin is warm and dry.  Neurological:     General: No focal deficit present.     Mental Status: He is alert and oriented to person, place, and time.  Psychiatric:        Mood and Affect: Mood normal.        Thought Content: Thought content normal.        Judgment: Judgment normal.      UC Treatments / Results  Labs (all labs ordered are listed, but only abnormal results are displayed) Labs Reviewed - No data to display  EKG None  Radiology No results  found.  Procedures Procedures (including critical care time)  Medications Ordered in UC Medications  ondansetron (ZOFRAN-ODT) disintegrating tablet 4 mg (has no administration in time range)    Initial Impression / Assessment and Plan / UC Course  I have reviewed the triage vital signs and the nursing notes.  Pertinent labs & imaging results that were available during my care of the patient were reviewed by me and considered in my medical decision making (see chart for details).     Final Clinical Impressions(s) / UC Diagnoses   Final diagnoses:  Influenza     Discharge Instructions     Return if vomiting becomes intractable or your symptoms worsen.    ED Prescriptions    Medication Sig Dispense Auth. Provider   ondansetron (ZOFRAN-ODT) 8 MG disintegrating tablet Take 1 tablet (8 mg total) by mouth every 8 (eight) hours as needed for nausea. 12 tablet Robyn Haber, MD   oseltamivir (TAMIFLU) 75 MG capsule Take 1 capsule (75 mg total) by mouth every 12 (twelve) hours. 10 capsule Robyn Haber, MD     Controlled Substance Prescriptions [place] Controlled Substance Registry consulted? Not Applicable   Robyn Haber, MD 05/11/18 [phone]

## 2018-05-11 NOTE — Discharge Instructions (Addendum)
Return if vomiting becomes intractable or your symptoms worsen.

## 2018-05-11 NOTE — ED Triage Notes (Signed)
Reports starting yesterday with cough, congestion, body aches, hoarse voice, nausea.  Vomiting today.  Unsure if fevers.

## 2021-05-30 ENCOUNTER — Other Ambulatory Visit: Payer: Self-pay | Admitting: Orthopedic Surgery

## 2021-05-30 DIAGNOSIS — M75112 Incomplete rotator cuff tear or rupture of left shoulder, not specified as traumatic: Secondary | ICD-10-CM

## 2021-06-05 ENCOUNTER — Ambulatory Visit
Admission: RE | Admit: 2021-06-05 | Discharge: 2021-06-05 | Disposition: A | Discharge status: Home / Self Care | Payer: Managed Care, Other (non HMO) | Source: Ambulatory Visit | Attending: Orthopedic Surgery | Admitting: Orthopedic Surgery

## 2021-06-05 ENCOUNTER — Other Ambulatory Visit: Payer: Self-pay

## 2021-06-05 DIAGNOSIS — M75112 Incomplete rotator cuff tear or rupture of left shoulder, not specified as traumatic: Secondary | ICD-10-CM | POA: Diagnosis present
